# Patient Record
Sex: Female | Born: 1970 | Race: Black or African American | Hispanic: No | Smoking: Never smoker
Health system: Southern US, Community
[De-identification: ages and names within clinical notes are randomized; demographics above are authoritative.]

## PROBLEM LIST (undated history)

## (undated) DIAGNOSIS — F329 Major depressive disorder, single episode, unspecified: Secondary | ICD-10-CM

## (undated) DIAGNOSIS — F32A Depression, unspecified: Secondary | ICD-10-CM

## (undated) DIAGNOSIS — J45909 Unspecified asthma, uncomplicated: Secondary | ICD-10-CM

## (undated) DIAGNOSIS — T7840XA Allergy, unspecified, initial encounter: Secondary | ICD-10-CM

## (undated) DIAGNOSIS — Z91018 Allergy to other foods: Secondary | ICD-10-CM

## (undated) DIAGNOSIS — Z9889 Other specified postprocedural states: Secondary | ICD-10-CM

## (undated) HISTORY — DX: Allergy, unspecified, initial encounter: T78.40XA

## (undated) HISTORY — DX: Major depressive disorder, single episode, unspecified: F32.9

## (undated) HISTORY — DX: Other specified postprocedural states: Z98.890

## (undated) HISTORY — DX: Depression, unspecified: F32.A

---

## 1999-12-01 ENCOUNTER — Encounter: Payer: Self-pay | Admitting: Family Medicine

## 1999-12-01 ENCOUNTER — Encounter: Admission: RE | Admit: 1999-12-01 | Discharge: 1999-12-01 | Payer: Self-pay | Admitting: Family Medicine

## 2000-12-27 ENCOUNTER — Other Ambulatory Visit: Admission: RE | Admit: 2000-12-27 | Discharge: 2000-12-27 | Payer: Self-pay | Admitting: Family Medicine

## 2001-11-24 ENCOUNTER — Emergency Department (HOSPITAL_COMMUNITY): Admission: EM | Admit: 2001-11-24 | Discharge: 2001-11-24 | Payer: Self-pay | Admitting: *Deleted

## 2003-06-05 ENCOUNTER — Inpatient Hospital Stay (HOSPITAL_COMMUNITY): Admission: AD | Admit: 2003-06-05 | Discharge: 2003-06-07 | Payer: Self-pay | Admitting: Obstetrics and Gynecology

## 2003-06-15 ENCOUNTER — Encounter: Admission: RE | Admit: 2003-06-15 | Discharge: 2003-07-15 | Payer: Self-pay | Admitting: Obstetrics and Gynecology

## 2003-07-12 ENCOUNTER — Other Ambulatory Visit: Admission: RE | Admit: 2003-07-12 | Discharge: 2003-07-12 | Payer: Self-pay | Admitting: Obstetrics and Gynecology

## 2003-07-16 ENCOUNTER — Encounter: Admission: RE | Admit: 2003-07-16 | Discharge: 2003-08-15 | Payer: Self-pay | Admitting: Obstetrics and Gynecology

## 2003-08-16 ENCOUNTER — Encounter: Admission: RE | Admit: 2003-08-16 | Discharge: 2003-09-15 | Payer: Self-pay | Admitting: Obstetrics and Gynecology

## 2004-09-24 ENCOUNTER — Other Ambulatory Visit: Admission: RE | Admit: 2004-09-24 | Discharge: 2004-09-24 | Payer: Self-pay | Admitting: Obstetrics and Gynecology

## 2006-08-23 ENCOUNTER — Emergency Department (HOSPITAL_COMMUNITY): Admission: EM | Admit: 2006-08-23 | Discharge: 2006-08-23 | Payer: Self-pay | Admitting: Family Medicine

## 2006-12-21 ENCOUNTER — Emergency Department (HOSPITAL_COMMUNITY): Admission: EM | Admit: 2006-12-21 | Discharge: 2006-12-21 | Payer: Self-pay | Admitting: Emergency Medicine

## 2010-01-27 ENCOUNTER — Encounter: Admission: RE | Admit: 2010-01-27 | Discharge: 2010-01-27 | Payer: Self-pay | Admitting: Obstetrics and Gynecology

## 2012-12-11 ENCOUNTER — Other Ambulatory Visit: Payer: Self-pay | Admitting: Family Medicine

## 2013-01-06 ENCOUNTER — Other Ambulatory Visit: Payer: Self-pay | Admitting: Family Medicine

## 2013-06-25 ENCOUNTER — Other Ambulatory Visit: Payer: Self-pay | Admitting: Family Medicine

## 2013-08-10 ENCOUNTER — Encounter: Payer: Self-pay | Admitting: Physician Assistant

## 2013-08-10 ENCOUNTER — Ambulatory Visit (INDEPENDENT_AMBULATORY_CARE_PROVIDER_SITE_OTHER): Admitting: Physician Assistant

## 2013-08-10 VITALS — BP 132/90 | HR 68 | Temp 98.3°F | Resp 18 | Ht 65.0 in | Wt 204.0 lb

## 2013-08-10 DIAGNOSIS — H109 Unspecified conjunctivitis: Secondary | ICD-10-CM

## 2013-08-10 DIAGNOSIS — J329 Chronic sinusitis, unspecified: Secondary | ICD-10-CM

## 2013-08-10 MED ORDER — MONTELUKAST SODIUM 10 MG PO TABS: ORAL_TABLET | ORAL | Status: DC

## 2013-08-10 MED ORDER — PREDNISONE 20 MG PO TABS: ORAL_TABLET | ORAL | Status: DC

## 2013-08-10 MED ORDER — CETIRIZINE HCL 10 MG PO TABS: ORAL_TABLET | ORAL | Status: DC

## 2013-08-10 MED ORDER — SULFACETAMIDE SODIUM 10 % OP SOLN: 2.0000 [drp] | OPHTHALMIC | Status: DC

## 2013-08-10 MED ORDER — AZITHROMYCIN 250 MG PO TABS: ORAL_TABLET | ORAL | Status: DC

## 2013-08-10 NOTE — Progress Notes (Signed)
    Patient ID: Pamela Wilkerson MRN: [id number], DOB: 09/27/1970, 43 y.o. Date of Encounter: 08/10/2013, 10:55 AM    Chief Complaint:  Chief Complaint  Patient presents with  . c/o ongoing sinus problem    this morning eyes where all crusted shut     HPI: 43 y.o. year old female reports that she has been having pain in both of her cheeks and teeth area off and on for 2 weeks. Says that the last several days have gotten worse with a lot of pain in both cheeks. Has been feeling " tired and crappy."  Had no known fevers or chills. No cough. This Morning developed mucousy drainage in both eyes and eyelashes his were crusty. Missed work this past Monday then "went to work and just  suffered through the last 2 days."     Home Meds: See attached medication section for any medications that were entered at today's visit. The computer does not put those onto this list.The following list is a list of meds entered prior to today's visit.   No current outpatient prescriptions on file prior to visit.   No current facility-administered medications on file prior to visit.    Allergies:  Allergies  Allergen Reactions  . Pineapple   . Pyridium [Phenazopyridine Hcl]   . Penicillins Itching and Rash  . Sudafed [Pseudoephedrine Hcl] Rash      Review of Systems: See HPI for pertinent ROS. All other ROS negative.    Physical Exam: Blood pressure 132/90, pulse 68, temperature 98.3 F (36.8 C), temperature source Oral, resp. rate 18, height 5\' 5"  (1.651 m), weight 204 lb (92.534 kg)., Body mass index is 33.95 kg/(m^2). General:  Female. Appears in no acute distress. HEENT: Normocephalic, atraumatic, eyes without discharge at present, conjunctiva with minimal injection, sclera non-icteric, nares are without discharge. Bilateral auditory canals clear, TM's are without perforation, pearly grey and translucent with reflective cone of light bilaterally. Oral cavity moist, posterior pharynx without exudate,  erythema, peritonsillar abscess. Bilateral maxillary sinuses are severely tender with percussion. Minimal tenderness with percussion of frontal sinuses. Neck: Supple. No thyromegaly. No lymphadenopathy. Lungs: Clear bilaterally to auscultation without wheezes, rales, or rhonchi. Breathing is unlabored. Heart: Regular rhythm. No murmurs, rubs, or gallops. Msk:  Strength and tone normal for age. Extremities/Skin: Warm and dry.  Neuro: Alert and oriented X 3. Moves all extremities spontaneously. Gait is normal. CNII-XII grossly in tact. Psych:  Responds to questions appropriately with a normal affect.     ASSESSMENT AND PLAN:  43 y.o. year old female with  1. Sinusitis Pcn allery - predniSONE (DELTASONE) 20 MG tablet; Take 3 daily for 2 days, then 2 daily for 2 days, then 1 daily for 2 days.  Dispense: 12 tablet; Refill: 0 - azithromycin (ZITHROMAX) 250 MG tablet; Day 1: Take 2 daily.  Days 2-5: Take 1 daily.  Dispense: 6 tablet; Refill: 0  2. Conjunctivitis - sulfacetamide (BLEPH-10) 10 % ophthalmic solution; Place 2 drops into both eyes every 3 (three) hours while awake.  Dispense: 15 mL; Refill: 0  Note given for out of work 08/06/13, 08/10/13, 1:30/15  F/U if symptoms do not resolve after completion of the above medications.   Signed, [address]Jehad Bisono Beth NewportDixon, [place], [facility] 08/10/2013 10:55 AM

## 2013-12-06 ENCOUNTER — Other Ambulatory Visit: Payer: Self-pay | Admitting: Obstetrics and Gynecology

## 2013-12-06 DIAGNOSIS — R928 Other abnormal and inconclusive findings on diagnostic imaging of breast: Secondary | ICD-10-CM

## 2013-12-19 ENCOUNTER — Ambulatory Visit
Admission: RE | Admit: 2013-12-19 | Discharge: 2013-12-19 | Disposition: A | Discharge status: Home / Self Care | Source: Ambulatory Visit | Attending: Obstetrics and Gynecology | Admitting: Obstetrics and Gynecology

## 2013-12-19 ENCOUNTER — Encounter (INDEPENDENT_AMBULATORY_CARE_PROVIDER_SITE_OTHER): Payer: Self-pay

## 2013-12-19 DIAGNOSIS — R928 Other abnormal and inconclusive findings on diagnostic imaging of breast: Secondary | ICD-10-CM

## 2014-04-15 ENCOUNTER — Encounter (HOSPITAL_COMMUNITY): Payer: Self-pay | Admitting: Emergency Medicine

## 2014-04-15 ENCOUNTER — Emergency Department (INDEPENDENT_AMBULATORY_CARE_PROVIDER_SITE_OTHER)
Admission: EM | Admit: 2014-04-15 | Discharge: 2014-04-15 | Disposition: A | Discharge status: Home / Self Care | Source: Home / Self Care

## 2014-04-15 DIAGNOSIS — J309 Allergic rhinitis, unspecified: Secondary | ICD-10-CM

## 2014-04-15 HISTORY — DX: Unspecified asthma, uncomplicated: J45.909

## 2014-04-15 HISTORY — DX: Allergy to other foods: Z91.018

## 2014-04-15 LAB — POCT RAPID STREP A: Streptococcus, Group A Screen (Direct): NEGATIVE

## 2014-04-15 MED ORDER — TRIAMCINOLONE ACETONIDE 55 MCG/ACT NA AERO: 2.0000 | INHALATION_SPRAY | Freq: Every day | NASAL | Status: DC

## 2014-04-15 MED ORDER — CETIRIZINE-PSEUDOEPHEDRINE ER 5-120 MG PO TB12: 1.0000 | ORAL_TABLET | Freq: Every day | ORAL | Status: DC

## 2014-04-15 NOTE — ED Notes (Signed)
C/o headache and head congestion onset 10/2.  Worse yesterday and tried Mucinex and tylenol.  Could not sleep the past 2 nights.  Took a benadryl last night.  Both ears are clogged up. Blowing blood tinged yellowish mucous from her nose.

## 2014-04-15 NOTE — ED Provider Notes (Signed)
CSN: [id number]     Arrival date & time 04/15/14  1032 History   None    Chief Complaint  Patient presents with  . Headache   (Consider location/radiation/quality/duration/timing/severity/associated sxs/prior Treatment)  HPI  She is a 43 year old female presenting today with complaints of a headache and sore throat that started this past Friday. Patient has a history of environmental allergies and asthma. Patient states she has had some shortness of breath with mild coughing. Patient is complaining of voice changes, but denies any difficulty swallowing. States she feels like she had a fever on Friday and is complaining of decreased appetite. Symptoms include cough, congestion, and sneezing; some itching and ear stuffiness/ear pain.    Past Medical History  Diagnosis Date  . Asthma   . Multiple food allergies    History reviewed. No pertinent past surgical history. History reviewed. No pertinent family history. History  Substance Use Topics  . Smoking status: Never Smoker   . Smokeless tobacco: Never Used  . Alcohol Use: No   OB History   Grav Para Term Preterm Abortions TAB SAB Ect Mult Living                 Review of Systems  Constitutional: Positive for fever, chills, appetite change and fatigue.  HENT: Positive for congestion, ear pain, sinus pressure, sneezing, sore throat and voice change. Negative for facial swelling and trouble swallowing.   Eyes: Negative.   Respiratory: Positive for cough and shortness of breath. Negative for choking, chest tightness, wheezing and stridor.   Cardiovascular: Negative.  Negative for chest pain, palpitations and leg swelling.  Gastrointestinal: Negative for nausea, vomiting, diarrhea and constipation.  Endocrine: Negative.   Genitourinary: Negative.   Musculoskeletal: Negative.   Skin: Negative.   Allergic/Immunologic: Positive for environmental allergies.  Neurological: Negative.   Hematological: Negative.    Psychiatric/Behavioral: Negative.   The patient states she's supposed to regularly take her Singulair and Zyrtec, however admits has not been taking it regularly as directed.  Allergies  Aspartame and phenylalanine; Food color red; Pineapple; Pyridium; and Penicillins  Home Medications   Prior to Admission medications   Medication Sig Start Date End Date Taking? Authorizing Provider  cetirizine (ZYRTEC) 10 MG tablet TAKE ONE TABLET BY MOUTH ONE TIME DAILY 08/10/13  Yes Dorena BodoMary B Dixon, PA-C  clonazePAM (KLONOPIN) 0.5 MG tablet Take 0.5 mg by mouth daily as needed for anxiety.   Yes Historical Provider, MD  DULoxetine (CYMBALTA) 30 MG capsule Take 30 mg by mouth 2 (two) times daily.   Yes Historical Provider, MD  Flaxseed, Linseed, (FLAX SEEDS PO) Take 2 tablets by mouth 3 (three) times daily.   Yes Historical Provider, MD  Ginger, Zingiber officinalis, (GINGER ROOT PO) Take 1 Can by mouth daily.   Yes Historical Provider, MD  Misc Natural Products (TART CHERRY ADVANCED PO) Take 2 tablets by mouth 3 (three) times daily.   Yes Historical Provider, MD  montelukast (SINGULAIR) 10 MG tablet TAKE ONE TABLET BY MOUTH ONE TIME DAILY 08/10/13  Yes Dorena BodoMary B Dixon, PA-C  Norethindrone-Ethinyl Estradiol-Fe Biphas (LO LOESTRIN FE) 1 MG-10 MCG / 10 MCG tablet Take 1 tablet by mouth daily.   Yes Historical Provider, MD  UNABLE TO FIND 1 tablet 2 (two) times daily. TUMERIC   Yes Historical Provider, MD  azithromycin (ZITHROMAX) 250 MG tablet Day 1: Take 2 daily.  Days 2-5: Take 1 daily. 08/10/13   Patriciaann ClanMary B Dixon, PA-C  cetirizine-pseudoephedrine (ZYRTEC-D) 5-120 MG per tablet Take  1 tablet by mouth daily. 04/15/14   Servando Salina, NP  predniSONE (DELTASONE) 20 MG tablet Take 3 daily for 2 days, then 2 daily for 2 days, then 1 daily for 2 days. 08/10/13   Patriciaann Clan Dixon, PA-C  sulfacetamide (BLEPH-10) 10 % ophthalmic solution Place 2 drops into both eyes every 3 (three) hours while awake. 08/10/13   Patriciaann Clan Dixon, PA-C   triamcinolone (NASACORT ALLERGY 24HR) 55 MCG/ACT AERO nasal inhaler Place 2 sprays into the nose daily. 04/15/14   Servando Salina, NP   BP 147/81  Pulse 78  Temp(Src) 98.4 F (36.9 C) (Oral)  Resp 18  SpO2 98%  LMP 03/25/2014  Physical Exam  Nursing note and vitals reviewed. Constitutional: She is oriented to person, place, and time. She appears well-developed and well-nourished. No distress.  HENT:  Head: Normocephalic and atraumatic.  Right Ear: External ear normal.  Left Ear: External ear normal.  Mild erythema noted in posterior oropharynx. No evidence of exudate or patches. Bilateral tympanic membranes pearly gray in appearance with light reflexes present but distorted secondary to presence of fluid present behind TMs.  Bony prominences visualized. Bilateral nares patent, however turbinates are swollen and boggy in appearance.  Eyes: Conjunctivae are normal. Pupils are equal, round, and reactive to light. Right eye exhibits no discharge. Left eye exhibits no discharge. No scleral icterus.  Neck: Normal range of motion. Neck supple. No tracheal deviation present.  Mild anterior cervical lymphadenopathy bilaterally. Negative for nuchal rigidity.  Cardiovascular: Normal rate, regular rhythm, normal heart sounds and intact distal pulses.  Exam reveals no gallop and no friction rub.   No murmur heard. Pulmonary/Chest: Effort normal and breath sounds normal. No stridor. No respiratory distress. She has no wheezes. She has no rales. She exhibits no tenderness.  No adventitious breath sounds noted.  Lymphadenopathy:    She has cervical adenopathy.  Neurological: She is alert and oriented to person, place, and time.  Skin: Skin is warm and dry. No rash noted. She is not diaphoretic. No erythema. No pallor.    ED Course  Procedures (including critical care time) Labs Review Labs Reviewed  POCT RAPID STREP A ([facility] URG CARE ONLY)   Imaging Review No results found.   MDM   1.  Allergic rhinitis, unspecified allergic rhinitis type    Meds ordered this encounter  Medications  . DULoxetine (CYMBALTA) 30 MG capsule    Sig: Take 30 mg by mouth 2 (two) times daily.  Marland Kitchen triamcinolone (NASACORT ALLERGY 24HR) 55 MCG/ACT AERO nasal inhaler    Sig: Place 2 sprays into the nose daily.    Dispense:  1 Inhaler    Refill:  2  . cetirizine-pseudoephedrine (ZYRTEC-D) 5-120 MG per tablet    Sig: Take 1 tablet by mouth daily.    Dispense:  15 tablet    Refill:  0    Discussed the appropriate use of antibiotics with patient.  Patient agrees with diagnosis and verbalizes understanding and agrees to plan of care.      Servando Salina, NP 04/15/14 1202

## 2014-04-15 NOTE — Discharge Instructions (Signed)
A neti pot is a container designed to rinse debris or mucus from your nasal cavity. You might use a neti pot to treat symptoms of nasal allergies, sinus problems or colds. °If you choose to make your own saltwater solution, it's important to use bottled water that has been distilled or sterilized. Tap water is acceptable if it's been boiled for several minutes and then left to cool until it is lukewarm. °To use the neti pot, tilt your head sideways over the sink and place the spout of the neti pot in the upper nostril. Breathing through your open mouth, gently pour the saltwater solution into your upper nostril so that the liquid drains through the lower nostril. Repeat on the other side. °Be sure to rinse the irrigation device after each use with similarly distilled, sterile, previously boiled and cooled, or filtered water and leave open to air dry. °Neti pots are often available in pharmacies and health food stores, as well online.  ° ° °Allergic Rhinitis °Allergic rhinitis is when the mucous membranes in the nose respond to allergens. Allergens are particles in the air that cause your body to have an allergic reaction. This causes you to release allergic antibodies. Through a chain of events, these eventually cause you to release histamine into the blood stream. Although meant to protect the body, it is this release of histamine that causes your discomfort, such as frequent sneezing, congestion, and an itchy, runny nose.  °CAUSES  °Seasonal allergic rhinitis (hay fever) is caused by pollen allergens that may come from grasses, trees, and weeds. Year-round allergic rhinitis (perennial allergic rhinitis) is caused by allergens such as house dust mites, pet dander, and mold spores.  °SYMPTOMS  °· Nasal stuffiness (congestion). °· Itchy, runny nose with sneezing and tearing of the eyes. °DIAGNOSIS  °Your health care provider can help you determine the allergen or allergens that trigger your symptoms. If you and your  health care provider are unable to determine the allergen, skin or blood testing may be used. °TREATMENT  °Allergic rhinitis does not have a cure, but it can be controlled by: °· Medicines and allergy shots (immunotherapy). °· Avoiding the allergen. °Hay fever may often be treated with antihistamines in pill or nasal spray forms. Antihistamines block the effects of histamine. There are over-the-counter medicines that may help with nasal congestion and swelling around the eyes. Check with your health care provider before taking or giving this medicine.  °If avoiding the allergen or the medicine prescribed do not work, there are many new medicines your health care provider can prescribe. Stronger medicine may be used if initial measures are ineffective. Desensitizing injections can be used if medicine and avoidance does not work. Desensitization is when a patient is given ongoing shots until the body becomes less sensitive to the allergen. Make sure you follow up with your health care provider if problems continue. °HOME CARE INSTRUCTIONS °It is not possible to completely avoid allergens, but you can reduce your symptoms by taking steps to limit your exposure to them. It helps to know exactly what you are allergic to so that you can avoid your specific triggers. °SEEK MEDICAL CARE IF:  °· You have a fever. °· You develop a cough that does not stop easily (persistent). °· You have shortness of breath. °· You start wheezing. °· Symptoms interfere with normal daily activities. °Document Released: 03/24/2001 Document Revised: 07/04/2013 Document Reviewed: 03/06/2013 °Exit[employer]® Patient Information ©2015 [employer]. This information is not intended to replace advice given   to you by your health care provider. Make sure you discuss any questions you have with your health care provider. ° °

## 2014-04-16 ENCOUNTER — Other Ambulatory Visit: Payer: Self-pay | Admitting: Physician Assistant

## 2014-04-16 NOTE — Telephone Encounter (Signed)
Medication refilled per protocol. 

## 2014-04-17 ENCOUNTER — Ambulatory Visit: Admitting: Family Medicine

## 2014-04-17 LAB — CULTURE, GROUP A STREP

## 2014-04-17 NOTE — ED Provider Notes (Signed)
Medical screening examination/treatment/procedure(s) were performed by resident physician or non-physician practitioner and as supervising physician I was immediately available for consultation/collaboration.   Barkley BrunsKINDL,Robby Bulkley DOUGLAS MD.   Linna HoffJames D Ezra Marquess, MD 04/17/14 [id number]

## 2014-08-21 ENCOUNTER — Encounter: Payer: Self-pay | Admitting: Family Medicine

## 2014-08-21 ENCOUNTER — Other Ambulatory Visit: Payer: Self-pay | Admitting: Physician Assistant

## 2014-08-21 NOTE — Telephone Encounter (Signed)
Pt has not been seen in over one year.  No show for last appt.  Refill denied.  Letter to patient to make appt.

## 2015-03-05 ENCOUNTER — Other Ambulatory Visit: Payer: Self-pay

## 2015-03-05 DIAGNOSIS — Z1231 Encounter for screening mammogram for malignant neoplasm of breast: Secondary | ICD-10-CM

## 2015-03-07 ENCOUNTER — Ambulatory Visit
Admission: RE | Admit: 2015-03-07 | Discharge: 2015-03-07 | Disposition: A | Discharge status: Home / Self Care | Source: Ambulatory Visit

## 2015-03-07 DIAGNOSIS — Z1231 Encounter for screening mammogram for malignant neoplasm of breast: Secondary | ICD-10-CM

## 2015-03-08 ENCOUNTER — Encounter: Payer: Self-pay | Admitting: Family Medicine

## 2015-03-08 ENCOUNTER — Ambulatory Visit (INDEPENDENT_AMBULATORY_CARE_PROVIDER_SITE_OTHER): Admitting: Family Medicine

## 2015-03-08 VITALS — BP 120/76 | HR 68 | Temp 98.6°F | Resp 14 | Ht 65.0 in | Wt 198.0 lb

## 2015-03-08 DIAGNOSIS — J019 Acute sinusitis, unspecified: Secondary | ICD-10-CM

## 2015-03-08 DIAGNOSIS — B9689 Other specified bacterial agents as the cause of diseases classified elsewhere: Secondary | ICD-10-CM

## 2015-03-08 MED ORDER — AZITHROMYCIN 250 MG PO TABS: ORAL_TABLET | ORAL | Status: DC

## 2015-03-08 MED ORDER — CETIRIZINE HCL 10 MG PO TABS: ORAL_TABLET | ORAL | Status: AC

## 2015-03-08 MED ORDER — TRIAMCINOLONE ACETONIDE 55 MCG/ACT NA AERO: 2.0000 | INHALATION_SPRAY | Freq: Every day | NASAL | Status: DC

## 2015-03-08 MED ORDER — EPINEPHRINE 0.3 MG/0.3ML IJ SOAJ: 0.3000 mg | Freq: Once | INTRAMUSCULAR | Status: AC

## 2015-03-08 MED ORDER — PREDNISONE 20 MG PO TABS: ORAL_TABLET | ORAL | Status: DC

## 2015-03-08 MED ORDER — MONTELUKAST SODIUM 10 MG PO TABS
ORAL_TABLET | ORAL | Status: DC
Start: 2015-03-08 — End: 2016-03-12

## 2015-03-08 MED ORDER — TRIAMCINOLONE ACETONIDE 55 MCG/ACT NA AERO: 2.0000 | INHALATION_SPRAY | Freq: Every day | NASAL | Status: AC

## 2015-03-08 MED ORDER — ALBUTEROL SULFATE HFA 108 (90 BASE) MCG/ACT IN AERS
INHALATION_SPRAY | RESPIRATORY_TRACT | Status: DC
Start: 2015-03-08 — End: 2017-04-21

## 2015-03-08 NOTE — Progress Notes (Signed)
Patient ID: Miamor Ayler, female   DOB: June 23, 1971, 44 y.o.   MRN: [id number]   Subjective:    Patient ID: Almyra Brace, female    DOB: 1970-11-27, 44 y.o.   MRN: [id number]  Patient presents for Illness  issue here with sinusitis. She has history of chronic sinus problems as well as allergy she was being followed by an allergist in the past. It has been over a year since she has had a sinus infection. She's been using Sudafed as well as her Nasacort but she has not been improving over the past week. The past 3 days have been severe. She has not had any significant fever. No known sick contacts otherwise.    Review Of Systems:  GEN- denies fatigue, fever, weight loss,weakness, recent illness HEENT- denies eye drainage, change in vision, +nasal discharge, CVS- denies chest pain, palpitations RESP- denies SOB, cough, wheeze ABD- denies N/V, change in stools, abd pain GU- denies dysuria, hematuria, dribbling, incontinence MSK- denies joint pain, muscle aches, injury Neuro- denies headache, dizziness, syncope, seizure activity       Objective:    BP 120/76 mmHg  Pulse 68  Temp(Src) 98.6 F (37 C) (Oral)  Resp 14  Ht  (1.651 m)  Wt 198 lb (89.812 kg)  BMI 32.95 kg/m2  LMP 02/15/2015 GEN- NAD, alert and oriented x3 HEENT- PERRL, EOMI, non injected sclera, pink conjunctiva, MMM, oropharynx mild injection, TM clear bilat no effusion,  + maxillary sinus tenderness, inflammed turbinates,  Nasal drainage  Neck- Supple, shotty  LAD CVS- RRR, no murmur RESP-CTAB EXT- No edema Pulses- Radial 2+          Assessment & Plan:      Problem List Items Addressed This Visit    None    Visit Diagnoses    Acute bacterial sinusitis    -  Primary    history of recurrent sinsuitis with underlying allergies, treat with zpak, steroids, continue nasal regimen, Sudafed to decongest    Relevant Medications    cetirizine (ZYRTEC) 10 MG tablet    triamcinolone (NASACORT ALLERGY 24HR) 55  MCG/ACT AERO nasal inhaler    predniSONE (DELTASONE) 20 MG tablet    azithromycin (ZITHROMAX) 250 MG tablet       Note: This dictation was prepared with Dragon dictation along with smaller phrase technology. Any transcriptional errors that result from this process are unintentional.

## 2015-03-08 NOTE — Patient Instructions (Signed)
Take antibiotics as prescribed Continue allergy medication F/U as needed

## 2015-03-08 NOTE — Addendum Note (Signed)
Addended by: Phillips Odor on: 03/08/2015 03:13 PM   Modules accepted: Orders

## 2015-09-11 ENCOUNTER — Ambulatory Visit: Admitting: Internal Medicine

## 2015-09-11 ENCOUNTER — Ambulatory Visit (INDEPENDENT_AMBULATORY_CARE_PROVIDER_SITE_OTHER): Payer: 59 | Admitting: Internal Medicine

## 2015-09-11 ENCOUNTER — Other Ambulatory Visit (INDEPENDENT_AMBULATORY_CARE_PROVIDER_SITE_OTHER): Payer: 59

## 2015-09-11 ENCOUNTER — Encounter: Payer: Self-pay | Admitting: Internal Medicine

## 2015-09-11 ENCOUNTER — Telehealth: Payer: Self-pay

## 2015-09-11 VITALS — BP 112/78 | HR 63 | Temp 98.0°F | Resp 12 | Ht 65.0 in | Wt 189.0 lb

## 2015-09-11 DIAGNOSIS — K5900 Constipation, unspecified: Secondary | ICD-10-CM

## 2015-09-11 DIAGNOSIS — Z23 Encounter for immunization: Secondary | ICD-10-CM

## 2015-09-11 DIAGNOSIS — Z0000 Encounter for general adult medical examination without abnormal findings: Secondary | ICD-10-CM

## 2015-09-11 DIAGNOSIS — E669 Obesity, unspecified: Secondary | ICD-10-CM

## 2015-09-11 LAB — CBC
HCT: 39.5 % (ref 36.0–46.0)
HEMOGLOBIN: 13 g/dL (ref 12.0–15.0)
MCHC: 32.8 g/dL (ref 30.0–36.0)
MCV: 80.5 fl (ref 78.0–100.0)
PLATELETS: 289 10*3/uL (ref 150.0–400.0)
RBC: 4.91 Mil/uL (ref 3.87–5.11)
RDW: 13.8 % (ref 11.5–15.5)
WBC: 6 10*3/uL (ref 4.0–10.5)

## 2015-09-11 LAB — COMPREHENSIVE METABOLIC PANEL
ALBUMIN: 4.2 g/dL (ref 3.5–5.2)
ALT: 12 U/L (ref 0–35)
AST: 13 U/L (ref 0–37)
Alkaline Phosphatase: 46 U/L (ref 39–117)
BILIRUBIN TOTAL: 0.4 mg/dL (ref 0.2–1.2)
BUN: 15 mg/dL (ref 6–23)
CALCIUM: 9.4 mg/dL (ref 8.4–10.5)
CHLORIDE: 104 meq/L (ref 96–112)
CO2: 28 mEq/L (ref 19–32)
CREATININE: 0.86 mg/dL (ref 0.40–1.20)
GFR: 92.66 mL/min (ref >60.00)
Glucose, Bld: 95 mg/dL (ref 70–99)
Potassium: 4.4 mEq/L (ref 3.5–5.1)
Sodium: 138 mEq/L (ref 135–145)
Total Protein: 7 g/dL (ref 6.0–8.3)

## 2015-09-11 LAB — HEMOGLOBIN A1C: Hgb A1c MFr Bld: 5.9 % (ref 4.6–6.5)

## 2015-09-11 LAB — LIPID PANEL
Cholesterol: 128 mg/dL (ref 0–200)
HDL: 52.3 mg/dL (ref >39.00)
LDL Cholesterol: 61 mg/dL (ref 0–99)
NONHDL: 75.22
TRIGLYCERIDES: 73 mg/dL (ref 0.0–149.0)
Total CHOL/HDL Ratio: 2
VLDL: 14.6 mg/dL (ref 0.0–40.0)

## 2015-09-11 LAB — TSH: TSH: 2.46 u[IU]/mL (ref 0.35–4.50)

## 2015-09-11 LAB — T4, FREE: Free T4: 0.8 ng/dL (ref 0.60–1.60)

## 2015-09-11 MED ORDER — LINACLOTIDE 290 MCG PO CAPS: 290.0000 ug | ORAL_CAPSULE | Freq: Every day | ORAL | Status: DC

## 2015-09-11 NOTE — Telephone Encounter (Signed)
PA initiated via CoverMyMeds Key [id number]

## 2015-09-11 NOTE — Progress Notes (Signed)
Pre visit review using our clinic review tool, if applicable. No additional management support is needed unless otherwise documented below in the visit note. 

## 2015-09-11 NOTE — Patient Instructions (Signed)
We have sent in linzess instead since the amitiza has red dye in the capsules.   Take 1 pill daily and within 1-2 weeks you should notice more regular bowel movements. Another medicine to consider trying that is over the counter is called senokot-d which helps to move the bowels a little bit better.   Health Maintenance, Female Adopting a healthy lifestyle and getting preventive care can go a long way to promote health and wellness. Talk with your health care provider about what schedule of regular examinations is right for you. This is a good chance for you to check in with your provider about disease prevention and staying healthy. In between checkups, there are plenty of things you can do on your own. Experts have done a lot of research about which lifestyle changes and preventive measures are most likely to keep you healthy. Ask your health care provider for more information. WEIGHT AND DIET  Eat a healthy diet  Be sure to include plenty of vegetables, fruits, low-fat dairy products, and lean protein.  Do not eat a lot of foods high in solid fats, added sugars, or salt.  Get regular exercise. This is one of the most important things you can do for your health.  Most adults should exercise for at least 150 minutes each week. The exercise should increase your heart rate and make you sweat (moderate-intensity exercise).  Most adults should also do strengthening exercises at least twice a week. This is in addition to the moderate-intensity exercise.  Maintain a healthy weight  Body mass index (BMI) is a measurement that can be used to identify possible weight problems. It estimates body fat based on height and weight. Your health care provider can help determine your BMI and help you achieve or maintain a healthy weight.  For females 58 years of age and older:   A BMI below 18.5 is considered underweight.  A BMI of 18.5 to 24.9 is normal.  A BMI of 25 to 29.9 is considered  overweight.  A BMI of 30 and above is considered obese.  Watch levels of cholesterol and blood lipids  You should start having your blood tested for lipids and cholesterol at 43 years of age, then have this test every 5 years.  You may need to have your cholesterol levels checked more often if:  Your lipid or cholesterol levels are high.  You are older than 43 years of age.  You are at high risk for heart disease.  CANCER SCREENING   Lung Cancer  Lung cancer screening is recommended for adults 64-40 years old who are at high risk for lung cancer because of a history of smoking.  A yearly low-dose CT scan of the lungs is recommended for people who:  Currently smoke.  Have quit within the past 15 years.  Have at least a 30-pack-year history of smoking. A pack year is smoking an average of one pack of cigarettes a day for 1 year.  Yearly screening should continue until it has been 15 years since you quit.  Yearly screening should stop if you develop a health problem that would prevent you from having lung cancer treatment.  Breast Cancer  Practice breast self-awareness. This means understanding how your breasts normally appear and feel.  It also means doing regular breast self-exams. Let your health care provider know about any changes, no matter how small.  If you are in your 20s or 30s, you should have a clinical breast exam (CBE) by  a health care provider every 1-3 years as part of a regular health exam.  If you are 50 or older, have a CBE every year. Also consider having a breast X-ray (mammogram) every year.  If you have a family history of breast cancer, talk to your health care provider about genetic screening.  If you are at high risk for breast cancer, talk to your health care provider about having an MRI and a mammogram every year.  Breast cancer gene (BRCA) assessment is recommended for women who have family members with BRCA-related cancers. BRCA-related  cancers include:  Breast.  Ovarian.  Tubal.  Peritoneal cancers.  Results of the assessment will determine the need for genetic counseling and BRCA1 and BRCA2 testing. Cervical Cancer Your health care provider may recommend that you be screened regularly for cancer of the pelvic organs (ovaries, uterus, and vagina). This screening involves a pelvic examination, including checking for microscopic changes to the surface of your cervix (Pap test). You may be encouraged to have this screening done every 3 years, beginning at age 43.  For women ages 92-65, health care providers may recommend pelvic exams and Pap testing every 3 years, or they may recommend the Pap and pelvic exam, combined with testing for human papilloma virus (HPV), every 5 years. Some types of HPV increase your risk of cervical cancer. Testing for HPV may also be done on women of any age with unclear Pap test results.  Other health care providers may not recommend any screening for nonpregnant women who are considered low risk for pelvic cancer and who do not have symptoms. Ask your health care provider if a screening pelvic exam is right for you.  If you have had past treatment for cervical cancer or a condition that could lead to cancer, you need Pap tests and screening for cancer for at least 20 years after your treatment. If Pap tests have been discontinued, your risk factors (such as having a new sexual partner) need to be reassessed to determine if screening should resume. Some women have medical problems that increase the chance of getting cervical cancer. In these cases, your health care provider may recommend more frequent screening and Pap tests. Colorectal Cancer  This type of cancer can be detected and often prevented.  Routine colorectal cancer screening usually begins at 43 years of age and continues through 43 years of age.  Your health care provider may recommend screening at an earlier age if you have risk  factors for colon cancer.  Your health care provider may also recommend using home test kits to check for hidden blood in the stool.  A small camera at the end of a tube can be used to examine your colon directly (sigmoidoscopy or colonoscopy). This is done to check for the earliest forms of colorectal cancer.  Routine screening usually begins at age 16.  Direct examination of the colon should be repeated every 5-10 years through 43 years of age. However, you may need to be screened more often if early forms of precancerous polyps or small growths are found. Skin Cancer  Check your skin from head to toe regularly.  Tell your health care provider about any new moles or changes in moles, especially if there is a change in a mole's shape or color.  Also tell your health care provider if you have a mole that is larger than the size of a pencil eraser.  Always use sunscreen. Apply sunscreen liberally and repeatedly throughout the day.  Protect yourself by wearing long sleeves, pants, a wide-brimmed hat, and sunglasses whenever you are outside. HEART DISEASE, DIABETES, AND HIGH BLOOD PRESSURE   High blood pressure causes heart disease and increases the risk of stroke. High blood pressure is more likely to develop in:  People who have blood pressure in the high end of the normal range (130-139/85-89 mm Hg).  People who are overweight or obese.  People who are African American.  If you are 29-4 years of age, have your blood pressure checked every 3-5 years. If you are 42 years of age or older, have your blood pressure checked every year. You should have your blood pressure measured twice--once when you are at a hospital or clinic, and once when you are not at a hospital or clinic. Record the average of the two measurements. To check your blood pressure when you are not at a hospital or clinic, you can use:  An automated blood pressure machine at a pharmacy.  A home blood pressure  monitor.  If you are between 27 years and 61 years old, ask your health care provider if you should take aspirin to prevent strokes.  Have regular diabetes screenings. This involves taking a blood sample to check your fasting blood sugar level.  If you are at a normal weight and have a low risk for diabetes, have this test once every three years after 43 years of age.  If you are overweight and have a high risk for diabetes, consider being tested at a younger age or more often. PREVENTING INFECTION  Hepatitis B  If you have a higher risk for hepatitis B, you should be screened for this virus. You are considered at high risk for hepatitis B if:  You were born in a country where hepatitis B is common. Ask your health care provider which countries are considered high risk.  Your parents were born in a high-risk country, and you have not been immunized against hepatitis B (hepatitis B vaccine).  You have HIV or AIDS.  You use needles to inject street drugs.  You live with someone who has hepatitis B.  You have had sex with someone who has hepatitis B.  You get hemodialysis treatment.  You take certain medicines for conditions, including cancer, organ transplantation, and autoimmune conditions. Hepatitis C  Blood testing is recommended for:  Everyone born from [birth year] through 1963.  Anyone with known risk factors for hepatitis C. Sexually transmitted infections (STIs)  You should be screened for sexually transmitted infections (STIs) including gonorrhea and chlamydia if:  You are sexually active and are younger than 43 years of age.  You are older than 43 years of age and your health care provider tells you that you are at risk for this type of infection.  Your sexual activity has changed since you were last screened and you are at an increased risk for chlamydia or gonorrhea. Ask your health care provider if you are at risk.  If you do not have HIV, but are at risk, it may be  recommended that you take a prescription medicine daily to prevent HIV infection. This is called pre-exposure prophylaxis (PrEP). You are considered at risk if:  You are sexually active and do not regularly use condoms or know the HIV status of your partner(s).  You take drugs by injection.  You are sexually active with a partner who has HIV. Talk with your health care provider about whether you are at high risk of being infected  with HIV. If you choose to begin PrEP, you should first be tested for HIV. You should then be tested every 3 months for as long as you are taking PrEP.  PREGNANCY   If you are premenopausal and you may become pregnant, ask your health care provider about preconception counseling.  If you may become pregnant, take 400 to 800 micrograms (mcg) of folic acid every day.  If you want to prevent pregnancy, talk to your health care provider about birth control (contraception). OSTEOPOROSIS AND MENOPAUSE   Osteoporosis is a disease in which the bones lose minerals and strength with aging. This can result in serious bone fractures. Your risk for osteoporosis can be identified using a bone density scan.  If you are 30 years of age or older, or if you are at risk for osteoporosis and fractures, ask your health care provider if you should be screened.  Ask your health care provider whether you should take a calcium or vitamin D supplement to lower your risk for osteoporosis.  Menopause may have certain physical symptoms and risks.  Hormone replacement therapy may reduce some of these symptoms and risks. Talk to your health care provider about whether hormone replacement therapy is right for you.  HOME CARE INSTRUCTIONS   Schedule regular health, dental, and eye exams.  Stay current with your immunizations.   Do not use any tobacco products including cigarettes, chewing tobacco, or electronic cigarettes.  If you are pregnant, do not drink alcohol.  If you are  breastfeeding, limit how much and how often you drink alcohol.  Limit alcohol intake to no more than 1 drink per day for nonpregnant women. One drink equals 12 ounces of beer, 5 ounces of wine, or 1 ounces of hard liquor.  Do not use street drugs.  Do not share needles.  Ask your health care provider for help if you need support or information about quitting drugs.  Tell your health care provider if you often feel depressed.  Tell your health care provider if you have ever been abused or do not feel safe at home.   This information is not intended to replace advice given to you by your health care provider. Make sure you discuss any questions you have with your health care provider.   Document Released: 01/12/2011 Document Revised: 07/20/2014 Document Reviewed: 05/31/2013 Elsevier Interactive Patient Education [employer].

## 2015-09-13 ENCOUNTER — Encounter: Payer: Self-pay | Admitting: Internal Medicine

## 2015-09-13 DIAGNOSIS — K5900 Constipation, unspecified: Secondary | ICD-10-CM | POA: Insufficient documentation

## 2015-09-13 DIAGNOSIS — E669 Obesity, unspecified: Secondary | ICD-10-CM | POA: Insufficient documentation

## 2015-09-13 NOTE — Assessment & Plan Note (Signed)
Rx for linzess at this time. Miralax has not been effective in the past and eats high fiber diet with enough water. She had requested to try amitiza but the red dye indicated the change to try linzess. No indication for workup at this time. Checking CBC to ensure no anemia (would be indication for workup). No family history of colon cancer.

## 2015-09-13 NOTE — Telephone Encounter (Signed)
PA has been approved until 03/19/2016.   LVM for pt to call back as soon as possible.  RE: PA for Linzess has been approved.   Called and faxed pharmacy the received notification.

## 2015-09-13 NOTE — Progress Notes (Signed)
   Subjective:    Patient ID: Pamela Wilkerson, female    DOB: 10/27/1970, 45 y.o.   MRN: [id number]  HPI The patient is a new 45 YO female coming in with several problems including constipation. She has had more problems with it in the last 10 years. No blood in the stool. Goes 1-2 times per week. Some straining associated with going. No significant hemorrhoids. Has tried fiber and diet changes. Drinks 80 oz water daily. Tries to exercise some.   [facility], social history reviewed and updated.   Review of Systems  Constitutional: Negative for fever, activity change, appetite change, fatigue and unexpected weight change.  HENT: Negative.   Eyes: Negative.   Respiratory: Negative for cough, chest tightness, shortness of breath and wheezing.   Cardiovascular: Negative for chest pain, palpitations and leg swelling.  Gastrointestinal: Positive for constipation and abdominal distention. Negative for nausea, vomiting, abdominal pain, diarrhea and blood in stool.  Musculoskeletal: Negative.   Skin: Negative.   Neurological: Negative.   Psychiatric/Behavioral: Negative.       Objective:   Physical Exam  Constitutional: She is oriented to person, place, and time. She appears well-developed and well-nourished.  Overweight  HENT:  Head: Normocephalic and atraumatic.  Eyes: EOM are normal.  Neck: Normal range of motion.  Cardiovascular: Normal rate and regular rhythm.   Pulmonary/Chest: Effort normal and breath sounds normal. No respiratory distress. She has no wheezes. She has no rales.  Abdominal: Soft. Bowel sounds are normal. She exhibits no distension. There is no tenderness. There is no rebound and no guarding.  Musculoskeletal: She exhibits no edema.  Neurological: She is alert and oriented to person, place, and time.  Skin: Skin is warm and dry.  Psychiatric: She has a normal mood and affect.   Filed Vitals:   09/11/15 1104  BP: 112/78  Pulse: 63  Temp: 98 F (36.7 C)  TempSrc: Oral    Resp: 12  Height: 5\' 5"  (1.651 m)  Weight: 189 lb (85.73 kg)  SpO2: 98%      Assessment & Plan:  Tdap given for visit.

## 2015-09-13 NOTE — Assessment & Plan Note (Signed)
She is working on her diet to help with weight loss and will improve her exercise.

## 2015-11-22 ENCOUNTER — Other Ambulatory Visit: Payer: Self-pay

## 2015-11-22 DIAGNOSIS — Z1231 Encounter for screening mammogram for malignant neoplasm of breast: Secondary | ICD-10-CM

## 2016-01-28 ENCOUNTER — Other Ambulatory Visit: Payer: Self-pay | Admitting: Internal Medicine

## 2016-01-28 ENCOUNTER — Ambulatory Visit
Admission: RE | Admit: 2016-01-28 | Discharge: 2016-01-28 | Disposition: A | Discharge status: Home / Self Care | Payer: 59 | Source: Ambulatory Visit

## 2016-01-28 ENCOUNTER — Other Ambulatory Visit: Payer: Self-pay

## 2016-01-28 DIAGNOSIS — Z1231 Encounter for screening mammogram for malignant neoplasm of breast: Secondary | ICD-10-CM

## 2016-03-06 ENCOUNTER — Ambulatory Visit
Admission: RE | Admit: 2016-03-06 | Discharge: 2016-03-06 | Disposition: A | Discharge status: Home / Self Care | Payer: 59 | Source: Ambulatory Visit | Attending: Internal Medicine | Admitting: Internal Medicine

## 2016-03-06 DIAGNOSIS — Z1231 Encounter for screening mammogram for malignant neoplasm of breast: Secondary | ICD-10-CM

## 2016-03-11 ENCOUNTER — Other Ambulatory Visit: Payer: Self-pay | Admitting: Family Medicine

## 2016-03-12 ENCOUNTER — Other Ambulatory Visit: Payer: Self-pay | Admitting: Internal Medicine

## 2016-04-16 ENCOUNTER — Other Ambulatory Visit: Payer: Self-pay | Admitting: Internal Medicine

## 2016-05-20 ENCOUNTER — Other Ambulatory Visit: Payer: Self-pay | Admitting: Internal Medicine

## 2016-07-28 DIAGNOSIS — F3289 Other specified depressive episodes: Secondary | ICD-10-CM | POA: Diagnosis not present

## 2016-07-28 DIAGNOSIS — F332 Major depressive disorder, recurrent severe without psychotic features: Secondary | ICD-10-CM | POA: Diagnosis not present

## 2016-07-28 DIAGNOSIS — G4700 Insomnia, unspecified: Secondary | ICD-10-CM | POA: Diagnosis not present

## 2016-07-28 DIAGNOSIS — F413 Other mixed anxiety disorders: Secondary | ICD-10-CM | POA: Diagnosis not present

## 2016-12-04 ENCOUNTER — Other Ambulatory Visit: Payer: Self-pay | Admitting: Obstetrics and Gynecology

## 2016-12-04 DIAGNOSIS — Z1231 Encounter for screening mammogram for malignant neoplasm of breast: Secondary | ICD-10-CM

## 2016-12-21 DIAGNOSIS — F3289 Other specified depressive episodes: Secondary | ICD-10-CM | POA: Diagnosis not present

## 2016-12-21 DIAGNOSIS — G4700 Insomnia, unspecified: Secondary | ICD-10-CM | POA: Diagnosis not present

## 2016-12-21 DIAGNOSIS — F332 Major depressive disorder, recurrent severe without psychotic features: Secondary | ICD-10-CM | POA: Diagnosis not present

## 2016-12-21 DIAGNOSIS — F413 Other mixed anxiety disorders: Secondary | ICD-10-CM | POA: Diagnosis not present

## 2017-01-07 DIAGNOSIS — F413 Other mixed anxiety disorders: Secondary | ICD-10-CM | POA: Diagnosis not present

## 2017-01-07 DIAGNOSIS — F332 Major depressive disorder, recurrent severe without psychotic features: Secondary | ICD-10-CM | POA: Diagnosis not present

## 2017-01-07 DIAGNOSIS — F3289 Other specified depressive episodes: Secondary | ICD-10-CM | POA: Diagnosis not present

## 2017-01-07 DIAGNOSIS — R635 Abnormal weight gain: Secondary | ICD-10-CM | POA: Diagnosis not present

## 2017-01-08 ENCOUNTER — Telehealth: Payer: Self-pay | Admitting: Internal Medicine

## 2017-01-08 NOTE — Telephone Encounter (Addendum)
Pt was started on metformin XR 500 for weight gain related to anti-psychotic and her A1C was elevated and Pamela Wilkerson has scheduled labs ro recheck A1C   [facility]  [phone]

## 2017-01-08 NOTE — Telephone Encounter (Signed)
I don't understand this message.  

## 2017-01-12 DIAGNOSIS — F413 Other mixed anxiety disorders: Secondary | ICD-10-CM | POA: Diagnosis not present

## 2017-01-12 DIAGNOSIS — F332 Major depressive disorder, recurrent severe without psychotic features: Secondary | ICD-10-CM | POA: Diagnosis not present

## 2017-01-12 DIAGNOSIS — R635 Abnormal weight gain: Secondary | ICD-10-CM | POA: Diagnosis not present

## 2017-01-12 DIAGNOSIS — F3289 Other specified depressive episodes: Secondary | ICD-10-CM | POA: Diagnosis not present

## 2017-03-08 ENCOUNTER — Encounter: Payer: Self-pay | Admitting: Nurse Practitioner

## 2017-03-08 ENCOUNTER — Ambulatory Visit (INDEPENDENT_AMBULATORY_CARE_PROVIDER_SITE_OTHER): Payer: BLUE CROSS/BLUE SHIELD | Admitting: Nurse Practitioner

## 2017-03-08 ENCOUNTER — Ambulatory Visit
Admission: RE | Admit: 2017-03-08 | Discharge: 2017-03-08 | Disposition: A | Discharge status: Home / Self Care | Payer: 59 | Source: Ambulatory Visit | Attending: Obstetrics and Gynecology | Admitting: Obstetrics and Gynecology

## 2017-03-08 ENCOUNTER — Other Ambulatory Visit (INDEPENDENT_AMBULATORY_CARE_PROVIDER_SITE_OTHER): Payer: BLUE CROSS/BLUE SHIELD

## 2017-03-08 VITALS — BP 118/82 | HR 59 | Temp 98.3°F | Ht 65.0 in | Wt 202.0 lb

## 2017-03-08 DIAGNOSIS — R739 Hyperglycemia, unspecified: Secondary | ICD-10-CM

## 2017-03-08 DIAGNOSIS — Z0001 Encounter for general adult medical examination with abnormal findings: Secondary | ICD-10-CM

## 2017-03-08 DIAGNOSIS — Z01419 Encounter for gynecological examination (general) (routine) without abnormal findings: Secondary | ICD-10-CM | POA: Diagnosis not present

## 2017-03-08 DIAGNOSIS — Z114 Encounter for screening for human immunodeficiency virus [HIV]: Secondary | ICD-10-CM

## 2017-03-08 DIAGNOSIS — Z6834 Body mass index (BMI) 34.0-34.9, adult: Secondary | ICD-10-CM | POA: Diagnosis not present

## 2017-03-08 DIAGNOSIS — Z1231 Encounter for screening mammogram for malignant neoplasm of breast: Secondary | ICD-10-CM

## 2017-03-08 DIAGNOSIS — K5904 Chronic idiopathic constipation: Secondary | ICD-10-CM

## 2017-03-08 LAB — LIPID PANEL
Cholesterol: 141 mg/dL (ref 0–200)
HDL: 63.6 mg/dL
LDL Cholesterol: 61 mg/dL (ref 0–99)
NonHDL: 77.44
Total CHOL/HDL Ratio: 2
Triglycerides: 81 mg/dL (ref 0.0–149.0)
VLDL: 16.2 mg/dL (ref 0.0–40.0)

## 2017-03-08 LAB — HEPATIC FUNCTION PANEL
ALK PHOS: 39 U/L (ref 39–117)
ALT: 11 U/L (ref 0–35)
AST: 15 U/L (ref 0–37)
Albumin: 3.8 g/dL (ref 3.5–5.2)
BILIRUBIN DIRECT: 0.2 mg/dL (ref 0.0–0.3)
BILIRUBIN TOTAL: 0.5 mg/dL (ref 0.2–1.2)
TOTAL PROTEIN: 6.7 g/dL (ref 6.0–8.3)

## 2017-03-08 LAB — BASIC METABOLIC PANEL
BUN: 12 mg/dL (ref 6–23)
CHLORIDE: 102 meq/L (ref 96–112)
CO2: 31 meq/L (ref 19–32)
Calcium: 9.1 mg/dL (ref 8.4–10.5)
Creatinine, Ser: 0.88 mg/dL (ref 0.40–1.20)
GFR: 89.61 mL/min (ref >60.00)
Glucose, Bld: 83 mg/dL (ref 70–99)
POTASSIUM: 3.9 meq/L (ref 3.5–5.1)
Sodium: 137 mEq/L (ref 135–145)

## 2017-03-08 LAB — CBC
HCT: 37.4 % (ref 36.0–46.0)
HEMOGLOBIN: 12.3 g/dL (ref 12.0–15.0)
MCHC: 32.8 g/dL (ref 30.0–36.0)
MCV: 81.7 fl (ref 78.0–100.0)
Platelets: 269 10*3/uL (ref 150.0–400.0)
RBC: 4.58 Mil/uL (ref 3.87–5.11)
RDW: 13.9 % (ref 11.5–15.5)
WBC: 6.8 10*3/uL (ref 4.0–10.5)

## 2017-03-08 LAB — TSH: TSH: 2.5 u[IU]/mL (ref 0.35–4.50)

## 2017-03-08 LAB — HEMOGLOBIN A1C: Hgb A1c MFr Bld: 5.7 % (ref 4.6–6.5)

## 2017-03-08 MED ORDER — LUBIPROSTONE 8 MCG PO CAPS: 8.0000 ug | ORAL_CAPSULE | Freq: Two times a day (BID) | ORAL | 0 refills | Status: DC

## 2017-03-08 NOTE — Progress Notes (Deleted)
Subjective:    Patient ID: Pamela Wilkerson, female    DOB: 1971-03-04, 46 y.o.   MRN: [id number]  Patient presents today for complete physical or establish care (new patient) and ***  HPI  constipation: Chronic x 1year.  no improvement with linzess No OTC used. Has BM 1-2times a week. No hard stools. ABD bloating. No FH of colon cancer of polyps. No diarrhea.  Insomnia: Managed by [facility]: Dr. Vikki Ports laVoire, NP-C Used of seroquel x 1year and trazodone x 8months,  Mood disorder: Use of lamictal.  Immunizations: (TDAP, Hep C screen, Pneumovax, Influenza, zoster)  Health Maintenance  Topic Date Due  . HIV Screening  10/16/1987  . Flu Shot  02/10/2017  . Pap Smear  12/25/2017  . Tetanus Vaccine  09/10/2025   Diet:weight watcher..  Weight:  Wt Readings from Last 3 Encounters:  03/08/17 202 lb (91.6 kg)  09/11/15 189 lb (85.7 kg)  03/08/15 198 lb (89.8 kg)   *** Exercise: walking, rowing, treadmil, elliptical, 5x/week Fall Risk:*** No flowsheet data found. Home Safety:*** Depression/Suicide:*** No flowsheet data found. No flowsheet data found. Colonoscopy (every 5-81yrs, >50-47yrs):*** Dexa (every 2-57yrs, >33yrs):*** Pap Smear (every 74yrs for >21-29 without HPV, every 85yrs for >30-42yrs with HPV):up to date with GYN Mammogram (yearly, >65yrs):up to date with GYN.  Vision:up to date Dental: up to date Advanced Directive:*** No flowsheet data found. Sexual History (birth control, marital status, STD):***   Medications and allergies reviewed with patient and updated if appropriate.  Patient Active Problem List   Diagnosis Date Noted  . Constipation 09/13/2015  . Obesity 09/13/2015    Current Outpatient Prescriptions on File Prior to Visit  Medication Sig Dispense Refill  . albuterol (VENTOLIN HFA) 108 (90 BASE) MCG/ACT inhaler Inhale two puffs by mouth every four to six hours as needed 18 g 6  . cetirizine (ZYRTEC) 10 MG tablet TAKE  ONE TABLET BY MOUTH ONE TIME DAILY 30 tablet 11  . clonazePAM (KLONOPIN) 0.5 MG tablet Take 0.5 mg by mouth daily as needed for anxiety.    Marland Kitchen EPINEPHrine 0.3 mg/0.3 mL IJ SOAJ injection Inject 0.3 mLs (0.3 mg total) into the muscle once. 1 Device 1  . lamoTRIgine (LAMICTAL) 100 MG tablet Take 100 mg by mouth daily.    . Misc Natural Products (TART CHERRY ADVANCED PO) Take 2 tablets by mouth 3 (three) times daily.    . montelukast (SINGULAIR) 10 MG tablet TAKE 1 TABLET DAILY. 30 tablet 0  . triamcinolone (NASACORT ALLERGY 24HR) 55 MCG/ACT AERO nasal inhaler Place 2 sprays into the nose daily. 1 Inhaler 2  . Flaxseed, Linseed, (FLAX SEEDS PO) Take 2 tablets by mouth 3 (three) times daily.    . Ginger, Zingiber officinalis, (GINGER ROOT PO) Take 1 Can by mouth daily.    . Norethindrone-Ethinyl Estradiol-Fe Biphas (LO LOESTRIN FE) 1 MG-10 MCG / 10 MCG tablet Take 1 tablet by mouth daily.    . traZODone (DESYREL) 50 MG tablet Take 50 mg by mouth at bedtime.     No current facility-administered medications on file prior to visit.     Past Medical History:  Diagnosis Date  . Allergy   . Asthma   . Depression   . Multiple food allergies     History reviewed. No pertinent surgical history.  Social History   Social History  . Marital status: Divorced    Spouse name: N/A  . Number of children: N/A  . Years of education: N/A  Social History Main Topics  . Smoking status: Never Smoker  . Smokeless tobacco: Never Used  . Alcohol use No  . Drug use: No  . Sexual activity: Not Currently   Other Topics Concern  . None   Social History Narrative  . None    Family History  Problem Relation Age of Onset  . Diabetes Maternal Uncle   . Mental retardation Father   . Hyperlipidemia Father   . Diabetes Maternal Aunt   . Cancer Maternal Aunt 57       stage 4 brain cancer.  . Arthritis Maternal Grandmother   . Stroke Maternal Grandmother   . Diabetes Maternal Grandmother   .  Hyperlipidemia Paternal Grandmother   . Heart disease Paternal Grandmother         ROS  Objective:   Vitals:   03/08/17 1339  BP: 118/82  Pulse: (!) 59  Temp: 98.3 F (36.8 C)  SpO2: 98%    Body mass index is 33.61 kg/m.   Physical Examination:  Physical Exam  ASSESSMENT and PLAN:  [name] was seen today for annual exam.  Diagnoses and all orders for this visit:  Encounter for preventative adult health care exam with abnormal findings -     Basic metabolic panel; Future -     Hepatic function panel; Future -     Lipid panel; Future -     CBC; Future -     TSH; Future -     Cancel: HIV antibody; Future  Chronic idiopathic constipation -     lubiprostone (AMITIZA) 8 MCG capsule; Take 1 capsule (8 mcg total) by mouth 2 (two) times daily with a meal.  Hyperglycemia -     Hemoglobin A1c; Future  Encounter for screening for human immunodeficiency virus (HIV) -     Cancel: HIV antibody; Future    No problem-specific Assessment & Plan notes found for this encounter.      Follow up: No Follow-up on file.  Alysia Penna, NP

## 2017-03-08 NOTE — Progress Notes (Signed)
Subjective:    Patient ID: Pamela Wilkerson, female    DOB: [birth year], 46 y.o.   MRN: [id number]  Patient presents today for complete physical  HPI Constipation: Chronic x 1year. Waxing and waning  no improvement with linzess, No OTC used. Has BM 1-2times a week. Denies any hard stool, but they are infrequent in last 1year and accompanied by ABD bloating No FH of colon cancer or polyps or constipation or IBS or crohns or colitis Denies any diarrhea or N/V or weight loss or melena or hematochezia or hemorrhoid  Insomnia: Managed by [facility]: Dr. Vikki Ports laVoire, NP-C Use of seroquel x 1year and trazodone x 8months. She is concerned about weight gain and memory, hence will like to stop seroquel.  Mood disorder: Also [facility] Use of lamictal and clonazepam.  Immunizations: (TDAP, Hep C screen, Pneumovax, Influenza, zoster)  Health Maintenance  Topic Date Due  . HIV Screening  10/16/1987  . Flu Shot  02/10/2017  . Pap Smear  12/25/2017  . Tetanus Vaccine  09/10/2025   Diet:weight watcher..  Weight:  Wt Readings from Last 3 Encounters:  03/08/17 202 lb (91.6 kg)  09/11/15 189 lb (85.7 kg)  03/08/15 198 lb (89.8 kg)    Exercise: walking, rowing, treadmil, elliptical, 5x/week.  Fall Risk: Fall Risk  03/08/2017  Falls in the past year? No   Home Safety:home alone.  Depression/Suicide: Depression screen PHQ 2/9 03/08/2017  Decreased Interest 1  Down, Depressed, Hopeless 1  PHQ - 2 Score 2  Suicidal thoughts 0   No flowsheet data found. Pap Smear (every 39yrs for >21-29 without HPV, every 88yrs for >30-42yrs with HPV):up to date with GYN  Mammogram (yearly, >25yrs):up to date with GYN.  Vision:up to date  Dental: up to date   Medications and allergies reviewed with patient and updated if appropriate.  Patient Active Problem List   Diagnosis Date Noted  . Constipation 09/13/2015  . Obesity 09/13/2015     Current Outpatient Prescriptions on File Prior to Visit  Medication Sig Dispense Refill  . albuterol (VENTOLIN HFA) 108 (90 BASE) MCG/ACT inhaler Inhale two puffs by mouth every four to six hours as needed 18 g 6  . cetirizine (ZYRTEC) 10 MG tablet TAKE ONE TABLET BY MOUTH ONE TIME DAILY 30 tablet 11  . clonazePAM (KLONOPIN) 0.5 MG tablet Take 0.5 mg by mouth daily as needed for anxiety.    Marland Kitchen EPINEPHrine 0.3 mg/0.3 mL IJ SOAJ injection Inject 0.3 mLs (0.3 mg total) into the muscle once. 1 Device 1  . lamoTRIgine (LAMICTAL) 100 MG tablet Take 100 mg by mouth daily.    . Misc Natural Products (TART CHERRY ADVANCED PO) Take 2 tablets by mouth 3 (three) times daily.    . montelukast (SINGULAIR) 10 MG tablet TAKE 1 TABLET DAILY. 30 tablet 0  . triamcinolone (NASACORT ALLERGY 24HR) 55 MCG/ACT AERO nasal inhaler Place 2 sprays into the nose daily. 1 Inhaler 2  . Flaxseed, Linseed, (FLAX SEEDS PO) Take 2 tablets by mouth 3 (three) times daily.    . Ginger, Zingiber officinalis, (GINGER ROOT PO) Take 1 Can by mouth daily.    . Norethindrone-Ethinyl Estradiol-Fe Biphas (LO LOESTRIN FE) 1 MG-10 MCG / 10 MCG tablet Take 1 tablet by mouth daily.    . traZODone (DESYREL) 50 MG tablet Take 50 mg by mouth at bedtime.     No current facility-administered medications on file prior to visit.     Past Medical  History:  Diagnosis Date  . Allergy   . Asthma   . Depression   . Multiple food allergies     History reviewed. No pertinent surgical history.  Social History   Social History  . Marital status: Divorced    Spouse name: N/A  . Number of children: N/A  . Years of education: N/A   Social History Main Topics  . Smoking status: Never Smoker  . Smokeless tobacco: Never Used  . Alcohol use No  . Drug use: No  . Sexual activity: Not Currently   Other Topics Concern  . None   Social History Narrative  . None    Family History  Problem Relation Age of Onset  . Diabetes Maternal  Uncle   . Mental retardation Father   . Hyperlipidemia Father   . Diabetes Maternal Aunt   . Cancer Maternal Aunt 57       stage 4 brain cancer.  . Arthritis Maternal Grandmother   . Stroke Maternal Grandmother   . Diabetes Maternal Grandmother   . Hyperlipidemia Paternal Grandmother   . Heart disease Paternal Grandmother         Review of Systems  Constitutional: Negative for fever, malaise/fatigue and weight loss.  HENT: Negative for congestion and sore throat.   Eyes:       Negative for visual changes  Respiratory: Negative for cough and shortness of breath.   Cardiovascular: Negative for chest pain, palpitations and leg swelling.  Gastrointestinal: Positive for constipation. Negative for blood in stool, diarrhea, heartburn, melena, nausea and vomiting.  Genitourinary: Negative for dysuria, frequency and urgency.  Musculoskeletal: Negative for falls, joint pain and myalgias.  Skin: Negative for rash.  Neurological: Negative for dizziness, sensory change, weakness and headaches.  Endo/Heme/Allergies: Does not bruise/bleed easily.  Psychiatric/Behavioral: Positive for depression. Negative for substance abuse and suicidal ideas. The patient is nervous/anxious and has insomnia.     Objective:   Vitals:   03/08/17 1339  BP: 118/82  Pulse: (!) 59  Temp: 98.3 F (36.8 C)  SpO2: 98%    Body mass index is 33.61 kg/m.   Physical Examination:  Physical Exam  Constitutional: She is oriented to person, place, and time and well-developed, well-nourished, and in no distress. No distress.  HENT:  Right Ear: External ear normal.  Left Ear: External ear normal.  Nose: Nose normal.  Mouth/Throat: Oropharynx is clear and moist. No oropharyngeal exudate.  Eyes: Pupils are equal, round, and reactive to light. Conjunctivae and EOM are normal. No scleral icterus.  Neck: Normal range of motion. Neck supple. No thyromegaly present.  Cardiovascular: Normal rate, normal heart sounds  and intact distal pulses.   Pulmonary/Chest: Effort normal and breath sounds normal. She exhibits no tenderness.  Abdominal: Soft. Bowel sounds are normal. She exhibits no distension. There is no tenderness.  Genitourinary:  Genitourinary Comments: Breast and pelvic exam deferred to GYN per patient  Musculoskeletal: Normal range of motion. She exhibits no edema or tenderness.  Lymphadenopathy:    She has no cervical adenopathy.  Neurological: She is alert and oriented to person, place, and time. Gait normal.  Skin: Skin is warm and dry.  Psychiatric: Affect and judgment normal.    ASSESSMENT and PLAN:  Louvina was seen today for annual exam.  Diagnoses and all orders for this visit:  Encounter for preventative adult health care exam with abnormal findings -     Basic metabolic panel; Future -     Hepatic function panel; Future -  Lipid panel; Future -     CBC; Future -     TSH; Future -     Cancel: HIV antibody; Future  Chronic idiopathic constipation -     lubiprostone (AMITIZA) 8 MCG capsule; Take 1 capsule (8 mcg total) by mouth 2 (two) times daily with a meal.  Hyperglycemia -     Hemoglobin A1c; Future    No problem-specific Assessment & Plan notes found for this encounter.      Follow up: Return if symptoms worsen or fail to improve.  Alysia Penna, NP

## 2017-03-08 NOTE — Patient Instructions (Addendum)
Please discuss discontinuation of Seroquel with psychiatrist.  Preventive Care 40-64 Years, Female Preventive care refers to lifestyle choices and visits with your health care provider that can promote health and wellness. What does preventive care include?  A yearly physical exam. This is also called an annual well check.  Dental exams once or twice a year.  Routine eye exams. Ask your health care provider how often you should have your eyes checked.  Personal lifestyle choices, including: ? Daily care of your teeth and gums. ? Regular physical activity. ? Eating a healthy diet. ? Avoiding tobacco and drug use. ? Limiting alcohol use. ? Practicing safe sex. ? Taking low-dose aspirin daily starting at age 26. ? Taking vitamin and mineral supplements as recommended by your health care provider. What happens during an annual well check? The services and screenings done by your health care provider during your annual well check will depend on your age, overall health, lifestyle risk factors, and family history of disease. Counseling Your health care provider may ask you questions about your:  Alcohol use.  Tobacco use.  Drug use.  Emotional well-being.  Home and relationship well-being.  Sexual activity.  Eating habits.  Work and work Statistician.  Method of birth control.  Menstrual cycle.  Pregnancy history.  Screening You may have the following tests or measurements:  Height, weight, and BMI.  Blood pressure.  Lipid and cholesterol levels. These may be checked every 5 years, or more frequently if you are over 48 years old.  Skin check.  Lung cancer screening. You may have this screening every year starting at age 14 if you have a 30-pack-year history of smoking and currently smoke or have quit within the past 15 years.  Fecal occult blood test (FOBT) of the stool. You may have this test every year starting at age 35.  Flexible sigmoidoscopy or  colonoscopy. You may have a sigmoidoscopy every 5 years or a colonoscopy every 10 years starting at age 91.  Hepatitis C blood test.  Hepatitis B blood test.  Sexually transmitted disease (STD) testing.  Diabetes screening. This is done by checking your blood sugar (glucose) after you have not eaten for a while (fasting). You may have this done every 1-3 years.  Mammogram. This may be done every 1-2 years. Talk to your health care provider about when you should start having regular mammograms. This may depend on whether you have a family history of breast cancer.  BRCA-related cancer screening. This may be done if you have a family history of breast, ovarian, tubal, or peritoneal cancers.  Pelvic exam and Pap test. This may be done every 3 years starting at age 45. Starting at age 63, this may be done every 5 years if you have a Pap test in combination with an HPV test.  Bone density scan. This is done to screen for osteoporosis. You may have this scan if you are at high risk for osteoporosis.  Discuss your test results, treatment options, and if necessary, the need for more tests with your health care provider. Vaccines Your health care provider may recommend certain vaccines, such as:  Influenza vaccine. This is recommended every year.  Tetanus, diphtheria, and acellular pertussis (Tdap, Td) vaccine. You may need a Td booster every 10 years.  Varicella vaccine. You may need this if you have not been vaccinated.  Zoster vaccine. You may need this after age 39.  Measles, mumps, and rubella (MMR) vaccine. You may need at least one  dose of MMR if you were born in 1957 or later. You may also need a second dose.  Pneumococcal 13-valent conjugate (PCV13) vaccine. You may need this if you have certain conditions and were not previously vaccinated.  Pneumococcal polysaccharide (PPSV23) vaccine. You may need one or two doses if you smoke cigarettes or if you have certain  conditions.  Meningococcal vaccine. You may need this if you have certain conditions.  Hepatitis A vaccine. You may need this if you have certain conditions or if you travel or work in places where you may be exposed to hepatitis A.  Hepatitis B vaccine. You may need this if you have certain conditions or if you travel or work in places where you may be exposed to hepatitis B.  Haemophilus influenzae type b (Hib) vaccine. You may need this if you have certain conditions.  Talk to your health care provider about which screenings and vaccines you need and how often you need them. This information is not intended to replace advice given to you by your health care provider. Make sure you discuss any questions you have with your health care provider. Document Released: 07/26/2015 Document Revised: 03/18/2016 Document Reviewed: 04/30/2015 Elsevier Interactive Patient Education  2017 [employer].

## 2017-03-09 DIAGNOSIS — F332 Major depressive disorder, recurrent severe without psychotic features: Secondary | ICD-10-CM | POA: Diagnosis not present

## 2017-03-09 DIAGNOSIS — F3289 Other specified depressive episodes: Secondary | ICD-10-CM | POA: Diagnosis not present

## 2017-03-09 DIAGNOSIS — F413 Other mixed anxiety disorders: Secondary | ICD-10-CM | POA: Diagnosis not present

## 2017-03-09 DIAGNOSIS — R635 Abnormal weight gain: Secondary | ICD-10-CM | POA: Diagnosis not present

## 2017-03-10 LAB — HM PAP SMEAR

## 2017-04-19 DIAGNOSIS — F3289 Other specified depressive episodes: Secondary | ICD-10-CM | POA: Diagnosis not present

## 2017-04-19 DIAGNOSIS — F332 Major depressive disorder, recurrent severe without psychotic features: Secondary | ICD-10-CM | POA: Diagnosis not present

## 2017-04-19 DIAGNOSIS — R635 Abnormal weight gain: Secondary | ICD-10-CM | POA: Diagnosis not present

## 2017-04-19 DIAGNOSIS — F413 Other mixed anxiety disorders: Secondary | ICD-10-CM | POA: Diagnosis not present

## 2017-04-21 ENCOUNTER — Other Ambulatory Visit: Payer: Self-pay | Admitting: Internal Medicine

## 2017-04-30 DIAGNOSIS — Z23 Encounter for immunization: Secondary | ICD-10-CM | POA: Diagnosis not present

## 2017-05-10 ENCOUNTER — Other Ambulatory Visit: Payer: Self-pay | Admitting: Internal Medicine

## 2017-07-04 ENCOUNTER — Other Ambulatory Visit: Payer: Self-pay | Admitting: Internal Medicine

## 2017-07-27 ENCOUNTER — Encounter: Payer: Self-pay | Admitting: Family Medicine

## 2017-07-27 ENCOUNTER — Ambulatory Visit: Payer: BLUE CROSS/BLUE SHIELD | Admitting: Family Medicine

## 2017-07-27 VITALS — BP 130/78 | HR 78 | Temp 98.8°F | Ht 65.0 in | Wt 194.0 lb

## 2017-07-27 DIAGNOSIS — J0110 Acute frontal sinusitis, unspecified: Secondary | ICD-10-CM | POA: Insufficient documentation

## 2017-07-27 MED ORDER — AZITHROMYCIN 250 MG PO TABS: ORAL_TABLET | ORAL | 0 refills | Status: DC

## 2017-07-27 NOTE — Patient Instructions (Signed)
Please try things such as zyrtec-D or allegra-D which is an antihistamine and decongestant.   Please try afrin which will help with nasal congestion but use for only three days.   Please also try using a netti pot on a regular occasion.  Honey can help with a sore throat.   

## 2017-07-27 NOTE — Assessment & Plan Note (Signed)
Possible for viral. No fevers.  - provided azithro to have on hand if no improvement  - counseled on supportive care

## 2017-07-27 NOTE — Progress Notes (Signed)
Pamela Wilkerson - 47 y.o. female MRN [id number]  Date of birth: 11/20/1970  SUBJECTIVE:  Including CC & ROS.  Chief Complaint  Patient presents with  . Sinus issues    Pamela Wilkerson is a 47 y.o. female that is presenting with  Sore throat/cough. Ongoing for one month. She has been taking Sudafed with no improvement. Denies fevers or chills. She admits to wheezing and coughing up mucous. She feels like her symptoms have been ongoing this time. Symptoms are moderate in nature. Has some improvement with using a humidifier.   Review of Systems  Constitutional: Negative for fever.  HENT: Positive for sinus pain and sore throat.   Respiratory: Positive for cough. Negative for shortness of breath.   Cardiovascular: Negative for chest pain.  Gastrointestinal: Negative for abdominal pain.  Musculoskeletal: Negative for gait problem.  Neurological: Negative for weakness.  Hematological: Negative for adenopathy.  Psychiatric/Behavioral: Negative for agitation.    HISTORY: Past Medical, Surgical, Social, and Family History Reviewed & Updated per EMR.   Pertinent Historical Findings include:  Past Medical History:  Diagnosis Date  . Allergy   . Asthma   . Depression   . Multiple food allergies     No past surgical history on file.  Allergies  Allergen Reactions  . Amoxicillin   . Aspartame And Phenylalanine Itching  . Food Color Red [Red Dye] Itching    and yellow dyes.  . Lactose Intolerance (Gi)   . Pineapple   . Pyridium [Phenazopyridine Hcl] Hives  . Strawberry (Diagnostic) Itching    Neck itching  . Penicillins Itching and Rash    Family History  Problem Relation Age of Onset  . Diabetes Maternal Uncle   . Mental retardation Father   . Hyperlipidemia Father   . Diabetes Maternal Aunt   . Cancer Maternal Aunt 57       stage 4 brain cancer.  . Arthritis Maternal Grandmother   . Stroke Maternal Grandmother   . Diabetes Maternal Grandmother   . Hyperlipidemia Paternal  Grandmother   . Heart disease Paternal Grandmother      Social History   Socioeconomic History  . Marital status: Divorced    Spouse name: Not on file  . Number of children: Not on file  . Years of education: Not on file  . Highest education level: Not on file  Social Needs  . Financial resource strain: Not on file  . Food insecurity - worry: Not on file  . Food insecurity - inability: Not on file  . Transportation needs - medical: Not on file  . Transportation needs - non-medical: Not on file  Occupational History  . Not on file  Tobacco Use  . Smoking status: Never Smoker  . Smokeless tobacco: Never Used  Substance and Sexual Activity  . Alcohol use: No  . Drug use: No  . Sexual activity: Not Currently  Other Topics Concern  . Not on file  Social History Narrative  . Not on file     PHYSICAL EXAM:  VS: BP 130/78 (BP Location: Left Arm, Patient Position: Sitting, Cuff Size: Normal)   Pulse 78   Temp 98.8 F (37.1 C) (Oral)   Ht 5\' 5"  (1.651 m)   Wt 194 lb (88 kg)   SpO2 98%   BMI 32.28 kg/m  Physical Exam Gen: NAD, alert, cooperative with exam,  ENT: normal lips, normal nasal mucosa, tympanic membranes clear and intact bilaterally, normal oropharynx, no cervical lymphadenopathy Eye: normal EOM, normal conjunctiva  and lids CV:  no edema, +2 pedal pulses, regular rate and rhythm, S1-S2   Resp: no accessory muscle use, non-labored, clear to auscultation bilaterally, no crackles or wheezes Skin: no rashes, no areas of induration  Neuro: normal tone, normal sensation to touch Psych:  normal insight, alert and oriented MSK: Normal gait, normal strength       ASSESSMENT & PLAN:   Acute non-recurrent frontal sinusitis Possible for viral. No fevers.  - provided azithro to have on hand if no improvement  - counseled on supportive care

## 2017-08-27 ENCOUNTER — Other Ambulatory Visit: Payer: Self-pay | Admitting: Internal Medicine

## 2017-09-16 DIAGNOSIS — N939 Abnormal uterine and vaginal bleeding, unspecified: Secondary | ICD-10-CM | POA: Diagnosis not present

## 2017-09-16 DIAGNOSIS — D259 Leiomyoma of uterus, unspecified: Secondary | ICD-10-CM | POA: Diagnosis not present

## 2017-09-30 ENCOUNTER — Other Ambulatory Visit: Payer: Self-pay | Admitting: Internal Medicine

## 2017-10-04 ENCOUNTER — Other Ambulatory Visit: Payer: Self-pay | Admitting: Internal Medicine

## 2017-11-10 ENCOUNTER — Other Ambulatory Visit: Payer: Self-pay | Admitting: Internal Medicine

## 2017-11-10 NOTE — Telephone Encounter (Signed)
Can you please make patient an appointment. Has not seen crawford in two years

## 2017-11-10 NOTE — Telephone Encounter (Signed)
montelukast refill Last OV: OV not found in chart where this med was addressed - was seen 07/27/17 with sinusitis- Last Refill:10/04/17 #30 tabs no refills  Pharmacy:[facility]  Dr Okey Dupre  Routing back to provider to review

## 2017-11-10 NOTE — Telephone Encounter (Signed)
Left message for patient to call back to schedule her physical appointment. Please let office know when appointment is scheduled so that this prescription can be sent in.

## 2017-11-10 NOTE — Telephone Encounter (Signed)
Copied from CRM [id number]. Topic: Inquiry >> Nov 10, 2017  9:09 AM Yvonna Alanis wrote: Reason for CRM: Patient called requesting a refill of Montelukast (SINGULAIR) 10 MG tablet. Patient contacted her pharmacy, but there are no refills available. Patient is completely out of this medication. Patient's preferred pharmacy is [facility] - [place], [place] - [address] [phone] (Phone)   [phone] (Fax).       Thank You!!!

## 2017-11-10 NOTE — Telephone Encounter (Signed)
Patient scheduled for CPE on 03/11/2018 at 9:00am with Crawford. She would like a call once the script is sent in.

## 2017-11-11 MED ORDER — MONTELUKAST SODIUM 10 MG PO TABS: 10.0000 mg | ORAL_TABLET | Freq: Every day | ORAL | 3 refills | Status: DC

## 2017-11-11 NOTE — Telephone Encounter (Signed)
Patient informed. Rx sent 

## 2017-11-21 DIAGNOSIS — Z9889 Other specified postprocedural states: Secondary | ICD-10-CM

## 2017-11-21 HISTORY — DX: Other specified postprocedural states: Z98.890

## 2017-12-10 DIAGNOSIS — D259 Leiomyoma of uterus, unspecified: Secondary | ICD-10-CM | POA: Diagnosis not present

## 2017-12-10 DIAGNOSIS — N920 Excessive and frequent menstruation with regular cycle: Secondary | ICD-10-CM | POA: Diagnosis not present

## 2017-12-21 DIAGNOSIS — F332 Major depressive disorder, recurrent severe without psychotic features: Secondary | ICD-10-CM | POA: Diagnosis not present

## 2017-12-21 DIAGNOSIS — F3289 Other specified depressive episodes: Secondary | ICD-10-CM | POA: Diagnosis not present

## 2017-12-21 DIAGNOSIS — R635 Abnormal weight gain: Secondary | ICD-10-CM | POA: Diagnosis not present

## 2017-12-21 DIAGNOSIS — F413 Other mixed anxiety disorders: Secondary | ICD-10-CM | POA: Diagnosis not present

## 2017-12-23 DIAGNOSIS — N939 Abnormal uterine and vaginal bleeding, unspecified: Secondary | ICD-10-CM | POA: Diagnosis not present

## 2017-12-23 DIAGNOSIS — N920 Excessive and frequent menstruation with regular cycle: Secondary | ICD-10-CM | POA: Diagnosis not present

## 2018-01-17 DIAGNOSIS — F332 Major depressive disorder, recurrent severe without psychotic features: Secondary | ICD-10-CM | POA: Diagnosis not present

## 2018-01-17 DIAGNOSIS — R635 Abnormal weight gain: Secondary | ICD-10-CM | POA: Diagnosis not present

## 2018-01-17 DIAGNOSIS — F413 Other mixed anxiety disorders: Secondary | ICD-10-CM | POA: Diagnosis not present

## 2018-01-17 DIAGNOSIS — F3289 Other specified depressive episodes: Secondary | ICD-10-CM | POA: Diagnosis not present

## 2018-03-02 ENCOUNTER — Other Ambulatory Visit: Payer: Self-pay | Admitting: Obstetrics and Gynecology

## 2018-03-02 DIAGNOSIS — Z1231 Encounter for screening mammogram for malignant neoplasm of breast: Secondary | ICD-10-CM

## 2018-03-11 ENCOUNTER — Other Ambulatory Visit: Payer: Self-pay | Admitting: Internal Medicine

## 2018-03-11 ENCOUNTER — Other Ambulatory Visit (INDEPENDENT_AMBULATORY_CARE_PROVIDER_SITE_OTHER): Payer: BLUE CROSS/BLUE SHIELD

## 2018-03-11 ENCOUNTER — Ambulatory Visit (INDEPENDENT_AMBULATORY_CARE_PROVIDER_SITE_OTHER): Payer: BLUE CROSS/BLUE SHIELD | Admitting: Internal Medicine

## 2018-03-11 ENCOUNTER — Encounter: Payer: Self-pay | Admitting: Internal Medicine

## 2018-03-11 VITALS — BP 104/80 | HR 77 | Temp 97.8°F | Ht 65.0 in | Wt 193.0 lb

## 2018-03-11 DIAGNOSIS — Z6832 Body mass index (BMI) 32.0-32.9, adult: Secondary | ICD-10-CM

## 2018-03-11 DIAGNOSIS — E6609 Other obesity due to excess calories: Secondary | ICD-10-CM | POA: Diagnosis not present

## 2018-03-11 DIAGNOSIS — Z23 Encounter for immunization: Secondary | ICD-10-CM | POA: Diagnosis not present

## 2018-03-11 DIAGNOSIS — K5904 Chronic idiopathic constipation: Secondary | ICD-10-CM

## 2018-03-11 DIAGNOSIS — Z0000 Encounter for general adult medical examination without abnormal findings: Secondary | ICD-10-CM

## 2018-03-11 LAB — LIPID PANEL
Cholesterol: 120 mg/dL (ref 0–200)
HDL: 60.8 mg/dL (ref >39.00)
LDL Cholesterol: 45 mg/dL (ref 0–99)
NonHDL: 59.07
TRIGLYCERIDES: 70 mg/dL (ref 0.0–149.0)
Total CHOL/HDL Ratio: 2
VLDL: 14 mg/dL (ref 0.0–40.0)

## 2018-03-11 LAB — CBC
HCT: 42.1 % (ref 36.0–46.0)
HEMOGLOBIN: 13.8 g/dL (ref 12.0–15.0)
MCHC: 32.6 g/dL (ref 30.0–36.0)
MCV: 81.9 fl (ref 78.0–100.0)
PLATELETS: 269 10*3/uL (ref 150.0–400.0)
RBC: 5.14 Mil/uL — AB (ref 3.87–5.11)
RDW: 13.9 % (ref 11.5–15.5)
WBC: 4.8 10*3/uL (ref 4.0–10.5)

## 2018-03-11 LAB — COMPREHENSIVE METABOLIC PANEL
ALBUMIN: 3.9 g/dL (ref 3.5–5.2)
ALT: 12 U/L (ref 0–35)
AST: 14 U/L (ref 0–37)
Alkaline Phosphatase: 32 U/L — ABNORMAL LOW (ref 39–117)
BILIRUBIN TOTAL: 0.5 mg/dL (ref 0.2–1.2)
BUN: 16 mg/dL (ref 6–23)
CALCIUM: 9.3 mg/dL (ref 8.4–10.5)
CO2: 27 mEq/L (ref 19–32)
CREATININE: 1.03 mg/dL (ref 0.40–1.20)
Chloride: 105 mEq/L (ref 96–112)
GFR: 74.39 mL/min (ref >60.00)
Glucose, Bld: 99 mg/dL (ref 70–99)
Potassium: 4.8 mEq/L (ref 3.5–5.1)
Sodium: 139 mEq/L (ref 135–145)
Total Protein: 6.9 g/dL (ref 6.0–8.3)

## 2018-03-11 LAB — TSH: TSH: 2.2 u[IU]/mL (ref 0.35–4.50)

## 2018-03-11 LAB — HEMOGLOBIN A1C: Hgb A1c MFr Bld: 5.8 % (ref 4.6–6.5)

## 2018-03-11 NOTE — Progress Notes (Signed)
   Subjective:    Patient ID: Pamela Wilkerson, female    DOB: 08/19/1970, 47 y.o.   MRN: [id number]  HPI The patient is a 47 YO female coming in for physical. No new concerns.   [facility]H, [facility], social history reviewed and updated.   Review of Systems  Constitutional: Negative.   HENT: Negative.   Eyes: Negative.   Respiratory: Negative for cough, chest tightness and shortness of breath.   Cardiovascular: Negative for chest pain, palpitations and leg swelling.  Gastrointestinal: Negative for abdominal distention, abdominal pain, constipation, diarrhea, nausea and vomiting.  Musculoskeletal: Negative.   Skin: Negative.   Neurological: Negative.   Psychiatric/Behavioral: Negative.       Objective:   Physical Exam  Constitutional: She is oriented to person, place, and time. She appears well-developed and well-nourished.  HENT:  Head: Normocephalic and atraumatic.  Eyes: EOM are normal.  Neck: Normal range of motion.  Cardiovascular: Normal rate and regular rhythm.  Pulmonary/Chest: Effort normal and breath sounds normal. No respiratory distress. She has no wheezes. She has no rales.  Abdominal: Soft. Bowel sounds are normal. She exhibits no distension. There is no tenderness. There is no rebound.  Musculoskeletal: She exhibits no edema.  Neurological: She is alert and oriented to person, place, and time. Coordination normal.  Skin: Skin is warm and dry.  Psychiatric: She has a normal mood and affect.   Vitals:   03/11/18 0847  BP: 104/80  Pulse: 77  Temp: 97.8 F (36.6 C)  TempSrc: Oral  SpO2: 97%  Weight: 193 lb (87.5 kg)  Height: 5\' 5"  (1.651 m)      Assessment & Plan:  Flu shot given at visit

## 2018-03-11 NOTE — Patient Instructions (Signed)

## 2018-03-11 NOTE — Assessment & Plan Note (Signed)
Taking magnesium daily for insomnia which is helping with constipation as well.

## 2018-03-11 NOTE — Assessment & Plan Note (Signed)
Flu shot given at visit. Tetanus up to date. Mammogram up to date, pap smear up to date with gyn. Counseled about sun safety and mole surveillance. Counseled about the dangers of distracted driving. Given 10 year screening recommendations. Declines HIV screening.

## 2018-03-11 NOTE — Assessment & Plan Note (Signed)
She is working on exercise to help with her weight.

## 2018-03-17 ENCOUNTER — Encounter: Payer: Self-pay | Admitting: Internal Medicine

## 2018-03-17 ENCOUNTER — Ambulatory Visit: Payer: BLUE CROSS/BLUE SHIELD | Admitting: Internal Medicine

## 2018-03-17 DIAGNOSIS — J309 Allergic rhinitis, unspecified: Secondary | ICD-10-CM | POA: Insufficient documentation

## 2018-03-17 DIAGNOSIS — J3089 Other allergic rhinitis: Secondary | ICD-10-CM | POA: Diagnosis not present

## 2018-03-17 MED ORDER — FLUTICASONE PROPIONATE 50 MCG/ACT NA SUSP: 2.0000 | Freq: Every day | NASAL | 6 refills | Status: AC

## 2018-03-17 NOTE — Patient Instructions (Signed)
We have sent in flonase to add to the regimen.

## 2018-03-17 NOTE — Progress Notes (Signed)
   Subjective:    Patient ID: Pamela Wilkerson, female    DOB: 1970/12/15, 47 y.o.   MRN: [id number]  HPI The patient is a 47 YO female coming in for sinus pressure going on for about 1 day. She is also having body aches. She is having some cough as well. Denies headache or migraines. She has tried sudafed and tylenol and some otc cough medicine. She denies improvement since onset. Sudafed helped for a while but symptoms returned. She had the body aches this morning and came in to evaluate this. Denies SOB.   Review of Systems  Constitutional: Negative for activity change, appetite change, chills, fatigue, fever and unexpected weight change.  HENT: Positive for congestion, postnasal drip, rhinorrhea, sinus pressure and sore throat. Negative for ear discharge, ear pain, sinus pain, sneezing, tinnitus, trouble swallowing and voice change.   Eyes: Negative.   Respiratory: Positive for cough. Negative for chest tightness, shortness of breath and wheezing.   Cardiovascular: Negative.   Gastrointestinal: Negative.   Musculoskeletal: Positive for myalgias.  Neurological: Positive for headaches.      Objective:   Physical Exam  Constitutional: She is oriented to person, place, and time. She appears well-developed and well-nourished.  HENT:  Head: Normocephalic and atraumatic.  Oropharynx with redness and clear drainage, nose with swollen turbinates and redness with clear drainage. No sinus pain or pressure on exam.   Eyes: EOM are normal.  Neck: Normal range of motion.  Cardiovascular: Normal rate and regular rhythm.  Pulmonary/Chest: Effort normal and breath sounds normal. No respiratory distress. She has no wheezes. She has no rales.  Musculoskeletal: She exhibits no edema.  Lymphadenopathy:    She has no cervical adenopathy.  Neurological: She is alert and oriented to person, place, and time. Coordination normal.  Skin: Skin is warm and dry.   Vitals:   03/17/18 1300  BP: 118/90  Pulse: 64    Temp: 98.6 F (37 C)  TempSrc: Oral  SpO2: 99%  Weight: 195 lb (88.5 kg)  Height: 5\' 5"  (1.651 m)      Assessment & Plan:

## 2018-03-17 NOTE — Assessment & Plan Note (Signed)
She is taking singulair and zyrtec. Not taking nasacort and replaced with flonase today. She will continue and add flonase. Advised her that she does not have a sinus infection today and that there is no indication for antibiotics until at least 2 weeks of sinus symptoms.

## 2018-03-23 ENCOUNTER — Telehealth: Payer: Self-pay | Admitting: Internal Medicine

## 2018-03-23 MED ORDER — BENZONATATE 200 MG PO CAPS: 200.0000 mg | ORAL_CAPSULE | Freq: Three times a day (TID) | ORAL | 0 refills | Status: DC | PRN

## 2018-03-23 NOTE — Telephone Encounter (Signed)
Copied from CRM [id number]. Topic: General - Other >> Mar 23, 2018  8:49 AM Leafy Ro wrote: Reason for CRM: pt saw dr Okey Dupre on 03-17-18. Pt has now develop a productive cough at times by mainly dry cough and would like something sent to her pharm. [facility] in [place]

## 2018-03-23 NOTE — Telephone Encounter (Signed)
Is she using flonase daily since visit? Have sent in tessalon perles to take up to 3 times per day as needed. If having any breathing problems recommend visit. Otherwise still only day 7 of symptoms so likely antibiotics are not indicated.

## 2018-03-23 NOTE — Telephone Encounter (Signed)
Patient informed of MD response and stated understanding  

## 2018-03-29 ENCOUNTER — Ambulatory Visit
Admission: RE | Admit: 2018-03-29 | Discharge: 2018-03-29 | Disposition: A | Discharge status: Home / Self Care | Payer: BLUE CROSS/BLUE SHIELD | Source: Ambulatory Visit | Attending: Obstetrics and Gynecology | Admitting: Obstetrics and Gynecology

## 2018-03-29 DIAGNOSIS — Z1231 Encounter for screening mammogram for malignant neoplasm of breast: Secondary | ICD-10-CM | POA: Diagnosis not present

## 2018-03-30 ENCOUNTER — Encounter: Payer: Self-pay | Admitting: Internal Medicine

## 2018-03-30 NOTE — Progress Notes (Unsigned)
Abstracted and sent to scan  

## 2018-04-01 ENCOUNTER — Other Ambulatory Visit: Payer: Self-pay | Admitting: Internal Medicine

## 2018-04-01 MED ORDER — ALBUTEROL SULFATE HFA 108 (90 BASE) MCG/ACT IN AERS: INHALATION_SPRAY | RESPIRATORY_TRACT | 0 refills | Status: AC

## 2018-04-01 NOTE — Telephone Encounter (Signed)
Copied from CRM [phone]. Topic: Quick Communication - Rx Refill/Question >> Apr 01, 2018  4:54 PM Baldo DaubAlexander, Amber L wrote: Medication: PROAIR HFA 108 (90 Base) MCG/ACT inhaler  Has the patient contacted their pharmacy? Yes - states our physician has never filled for her before (Agent: If no, request that the patient contact the pharmacy for the refill.) (Agent: If yes, when and what did the pharmacy advise?)  Preferred Pharmacy (with phone number or street name): [facility] [place] - [phone] (Phone) [phone] (Fax  Agent: Please be advised that RX refills may take up to 3 business days. We ask that you follow-up with your pharmacy.

## 2018-04-12 ENCOUNTER — Ambulatory Visit: Payer: Self-pay | Admitting: Internal Medicine

## 2018-04-12 DIAGNOSIS — T7840XA Allergy, unspecified, initial encounter: Secondary | ICD-10-CM | POA: Diagnosis not present

## 2018-04-12 NOTE — Telephone Encounter (Signed)
FYI

## 2018-04-12 NOTE — Telephone Encounter (Signed)
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 Within 1 hour, she developed itching and throat feeling "tight" Pt has had same reaction to aspartame in the past. Pt c/o itching "all over" and c/o throat tightness. Pt stated she is not having trouble swallowing or trouble breathing. Pt advised to go to [facility]. Reason for Disposition . Nursing judgment or information in reference  Answer Assessment - Initial Assessment Questions 1. REASON FOR CALL: "What is your main concern right now?"     Throat itching and generalized itching 2. ONSET: "When did the itching and throat tightness start?"     yesterday 3. SEVERITY: "How bad is the itching and tight throat?"     Pt states that she took 50 mg Benadryl yesterday and Pepcid AC and today still having symptoms 4. FEVER: "Do you have a fever?"     no 5. OTHER SYMPTOMS: "Do you have any other new symptoms?"     no 6. INTERVENTIONS AND RESPONSE: "What have you done so far to try to make this better? What medications have you used?"     Benadryl, Pepcid AC 7. PREGNANCY: "Is there any chance you are pregnant?"     n/a  Protocols used: NO GUIDELINE AVAILABLE-A-AH

## 2018-06-03 DIAGNOSIS — Z01419 Encounter for gynecological examination (general) (routine) without abnormal findings: Secondary | ICD-10-CM | POA: Diagnosis not present

## 2018-06-03 DIAGNOSIS — Z6831 Body mass index (BMI) 31.0-31.9, adult: Secondary | ICD-10-CM | POA: Diagnosis not present

## 2018-08-15 ENCOUNTER — Encounter: Payer: Self-pay | Admitting: Internal Medicine

## 2018-08-15 ENCOUNTER — Ambulatory Visit: Payer: BLUE CROSS/BLUE SHIELD | Admitting: Internal Medicine

## 2018-08-15 VITALS — BP 118/78 | HR 70 | Temp 98.4°F | Resp 16 | Ht 65.0 in | Wt 201.8 lb

## 2018-08-15 DIAGNOSIS — J069 Acute upper respiratory infection, unspecified: Secondary | ICD-10-CM | POA: Diagnosis not present

## 2018-08-15 MED ORDER — HYDROCOD POLST-CPM POLST ER 10-8 MG/5ML PO SUER: 5.0000 mL | Freq: Two times a day (BID) | ORAL | 0 refills | Status: DC | PRN

## 2018-08-15 NOTE — Assessment & Plan Note (Signed)
Current symptoms started about 1 week ago, over the past month she has had cold symptoms that have waxed and waned and discussed with her she likely had more than 1 illness Current symptoms suggestive of viral etiology No need for an antibiotic Tussionex cough syrup Continue albuterol inhaler as needed Coricidin cold products Rest, fluids Call if no improvement

## 2018-08-15 NOTE — Progress Notes (Signed)
Subjective:    Patient ID: Pamela Wilkerson, female    DOB: Jan 28, 1971, 48 y.o.   MRN: [id number]  HPI She is here for an acute visit for cold symptoms.  Her symptoms started about one month ago and she gets this every January.  It sort of went away and then got worse.  Her current symptoms got worse over the past week.  She admits she may have had more than one thing over the past month.  She is experiencing nasal congestion, cough that is productive of green sputum, shortness of breath, wheezing, chest pain from coughing so much and some dizziness.  She denies any fevers, ear pain, sinus pain, sore throat, nausea, diarrhea, body aches and headaches.  She has taken her albuterol inhaler and cough suppressants.  She is still going to work.   Medications and allergies reviewed with patient and updated if appropriate.  Patient Active Problem List   Diagnosis Date Noted  . Allergic rhinitis 03/17/2018  . Routine general medical examination at a health care facility 03/11/2018  . Constipation 09/13/2015  . Obesity 09/13/2015    Current Outpatient Medications on File Prior to Visit  Medication Sig Dispense Refill  . albuterol (PROAIR HFA) 108 (90 Base) MCG/ACT inhaler INAHLE 2 PUFFS EVERY FOUR TO SIX HOURS AS NEEDED. 8.5 g 0  . benzonatate (TESSALON) 200 MG capsule Take 1 capsule (200 mg total) by mouth 3 (three) times daily as needed. 60 capsule 0  . cetirizine (ZYRTEC) 10 MG tablet TAKE ONE TABLET BY MOUTH ONE TIME DAILY 30 tablet 11  . clonazePAM (KLONOPIN) 0.5 MG tablet Take 0.5 mg by mouth daily as needed for anxiety.    Marland Kitchen EPINEPHrine 0.3 mg/0.3 mL IJ SOAJ injection Inject 0.3 mLs (0.3 mg total) into the muscle once. 1 Device 1  . Flaxseed, Linseed, (FLAX SEEDS PO) Take 2 tablets by mouth 3 (three) times daily.    . fluticasone (FLONASE) 50 MCG/ACT nasal spray Place 2 sprays into both nostrils daily. 16 g 6  . lamoTRIgine (LAMICTAL) 100 MG tablet Take 100 mg by mouth daily.    .  Magnesium 500 MG TABS Take 500 mg by mouth.    . Misc Natural Products (TART CHERRY ADVANCED PO) Take 2 tablets by mouth 3 (three) times daily.    . montelukast (SINGULAIR) 10 MG tablet TAKE 1 TABLET BY MOUTH DAILY. 90 tablet 3  . traZODone (DESYREL) 100 MG tablet Take 100 mg by mouth at bedtime.    . triamcinolone (NASACORT ALLERGY 24HR) 55 MCG/ACT AERO nasal inhaler Place 2 sprays into the nose daily. 1 Inhaler 2   No current facility-administered medications on file prior to visit.     Past Medical History:  Diagnosis Date  . Allergy   . Asthma   . Depression   . H/O prior ablation treatment 11/21/2017   on uterus  . Multiple food allergies     No past surgical history on file.  Social History   Socioeconomic History  . Marital status: Divorced    Spouse name: Not on file  . Number of children: Not on file  . Years of education: Not on file  . Highest education level: Not on file  Occupational History  . Not on file  Social Needs  . Financial resource strain: Not on file  . Food insecurity:    Worry: Not on file    Inability: Not on file  . Transportation needs:    Medical: Not on  file    Non-medical: Not on file  Tobacco Use  . Smoking status: Never Smoker  . Smokeless tobacco: Never Used  Substance and Sexual Activity  . Alcohol use: No  . Drug use: No  . Sexual activity: Not Currently  Lifestyle  . Physical activity:    Days per week: Not on file    Minutes per session: Not on file  . Stress: Not on file  Relationships  . Social connections:    Talks on phone: Not on file    Gets together: Not on file    Attends religious service: Not on file    Active member of club or organization: Not on file    Attends meetings of clubs or organizations: Not on file    Relationship status: Not on file  Other Topics Concern  . Not on file  Social History Narrative  . Not on file    Family History  Problem Relation Age of Onset  . Diabetes Maternal Uncle   .  Mental retardation Father   . Hyperlipidemia Father   . Diabetes Maternal Aunt   . Cancer Maternal Aunt 57       stage 4 brain cancer.  . Arthritis Maternal Grandmother   . Stroke Maternal Grandmother   . Diabetes Maternal Grandmother   . Hyperlipidemia Paternal Grandmother   . Heart disease Paternal Grandmother     Review of Systems  Constitutional: Negative for chills and fever.  HENT: Positive for congestion. Negative for ear pain, sinus pain and sore throat.   Respiratory: Positive for cough (productive with green sputum. ), shortness of breath and wheezing.   Cardiovascular: Positive for chest pain (from cough).  Gastrointestinal: Negative for diarrhea and nausea.  Musculoskeletal: Negative for myalgias.  Neurological: Positive for dizziness. Negative for light-headedness and headaches.       Objective:   Vitals:   08/15/18 1524  BP: 118/78  Pulse: 70  Resp: 16  Temp: 98.4 F (36.9 C)  SpO2: 99%   Filed Weights   08/15/18 1524  Weight: 201 lb 12.8 oz (91.5 kg)   Body mass index is 33.58 kg/m.  Wt Readings from Last 3 Encounters:  08/15/18 201 lb 12.8 oz (91.5 kg)  03/17/18 195 lb (88.5 kg)  03/11/18 193 lb (87.5 kg)     Physical Exam GENERAL APPEARANCE: Appears stated age, well appearing, NAD EYES: conjunctiva clear, no icterus HEENT: bilateral tympanic membranes and ear canals normal, oropharynx with no erythema, no thyromegaly, trachea midline, no cervical or supraclavicular lymphadenopathy LUNGS: Clear to auscultation without wheeze or crackles, unlabored breathing, good air entry bilaterally CARDIOVASCULAR: Normal S1,S2 without murmurs, no edema SKIN: warm, dry        Assessment & Plan:   See Problem List for Assessment and Plan of chronic medical problems.

## 2018-08-15 NOTE — Patient Instructions (Signed)
Use the cough syrup as needed.  Take the coricidin cold products as needed and other over the counter cold medications, advil and tylenol.  Increase your fluids and rest.    Call if no improvement

## 2018-10-04 DIAGNOSIS — F331 Major depressive disorder, recurrent, moderate: Secondary | ICD-10-CM | POA: Diagnosis not present

## 2018-10-04 DIAGNOSIS — R635 Abnormal weight gain: Secondary | ICD-10-CM | POA: Diagnosis not present

## 2018-10-04 DIAGNOSIS — F413 Other mixed anxiety disorders: Secondary | ICD-10-CM | POA: Diagnosis not present

## 2018-10-04 DIAGNOSIS — F3289 Other specified depressive episodes: Secondary | ICD-10-CM | POA: Diagnosis not present

## 2018-10-11 IMAGING — MG DIGITAL SCREENING BILATERAL MAMMOGRAM WITH TOMO AND CAD
8 series · 8 of 24 positions shown · non-contrast
Comparison: Previous exam(s).

CLINICAL DATA: Screening.

EXAM:
DIGITAL SCREENING BILATERAL MAMMOGRAM WITH TOMO AND CAD

[L CC synth-2D]
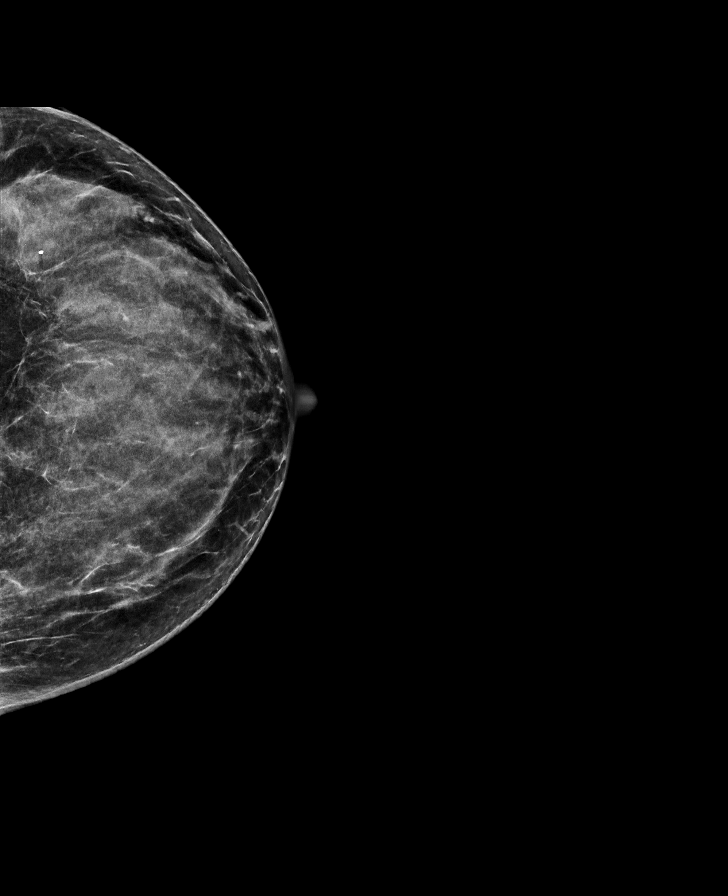

[R MLO synth-2D]
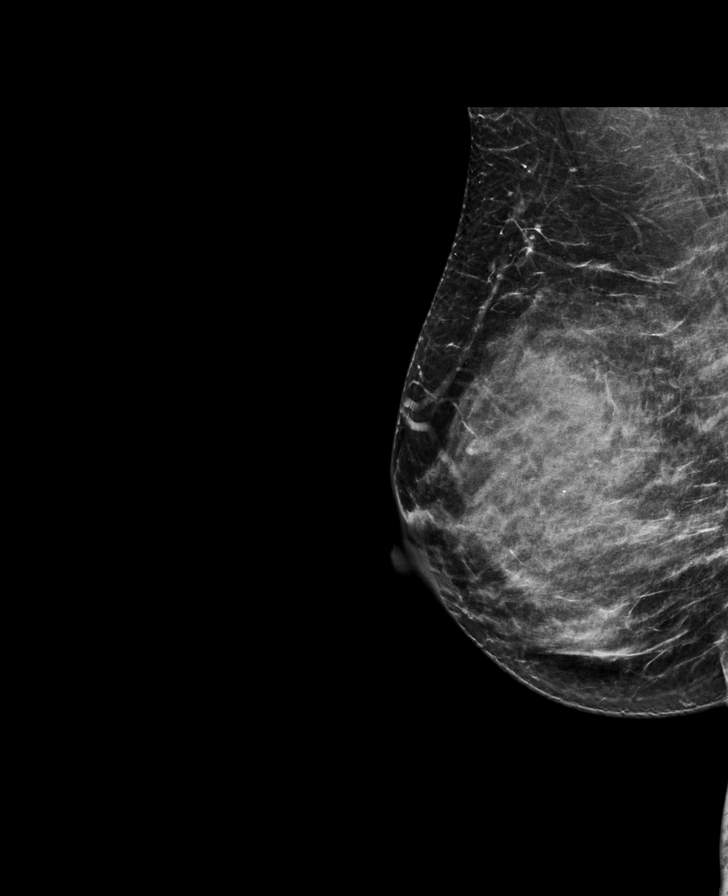

[L MLO synth-2D]
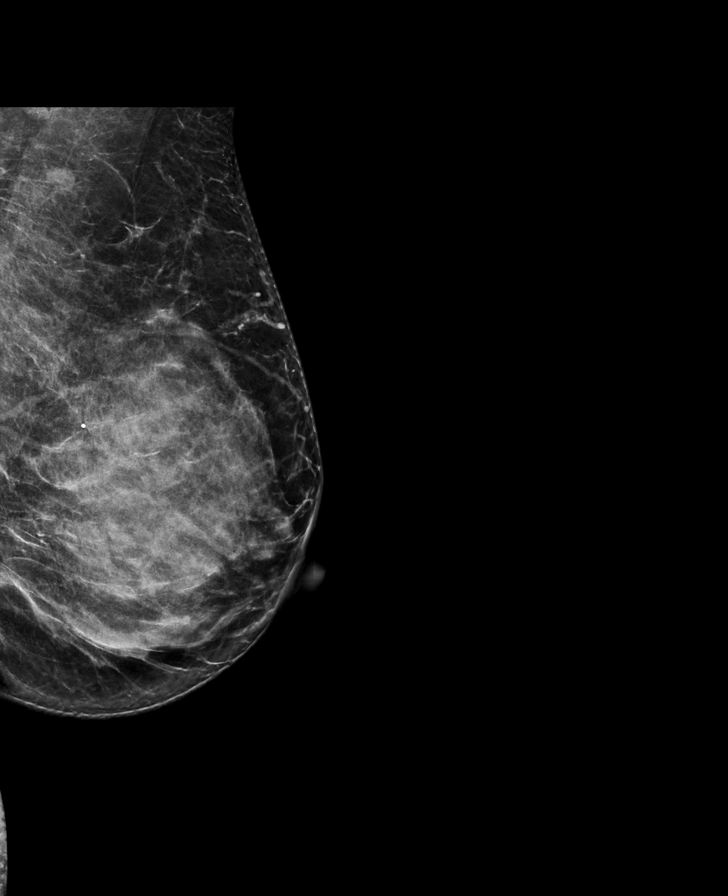

[R CC synth-2D]
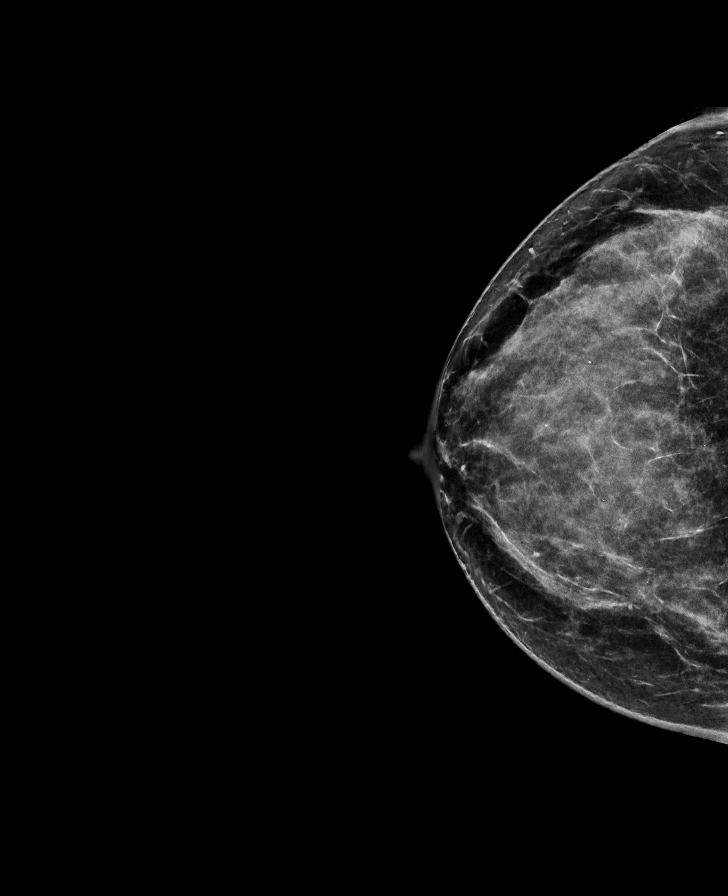

[L MLO tomo · tomo slice 36/71.0]
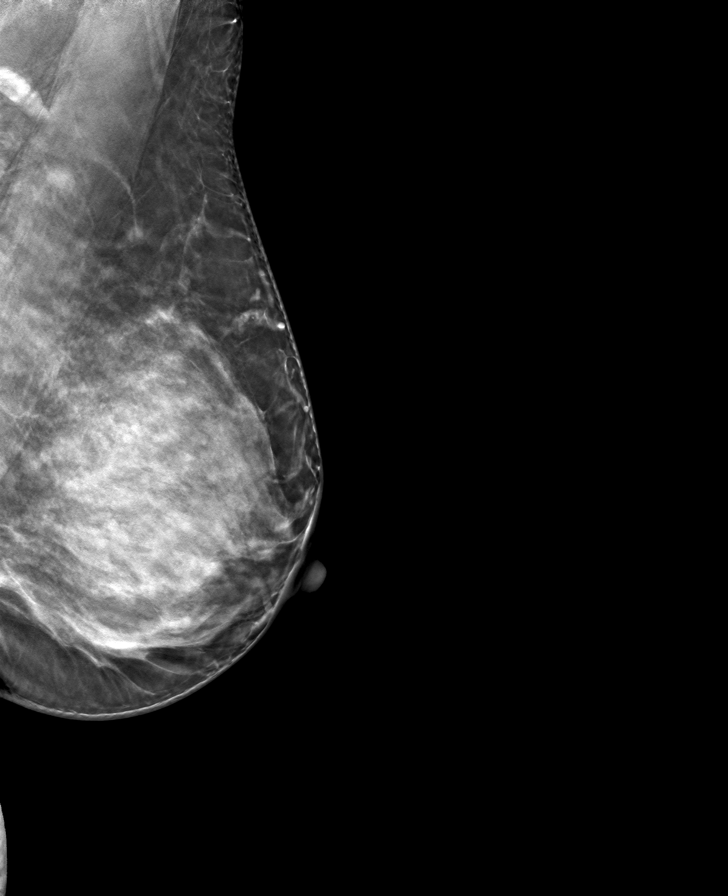

[R MLO tomo · tomo slice 35/70.0]
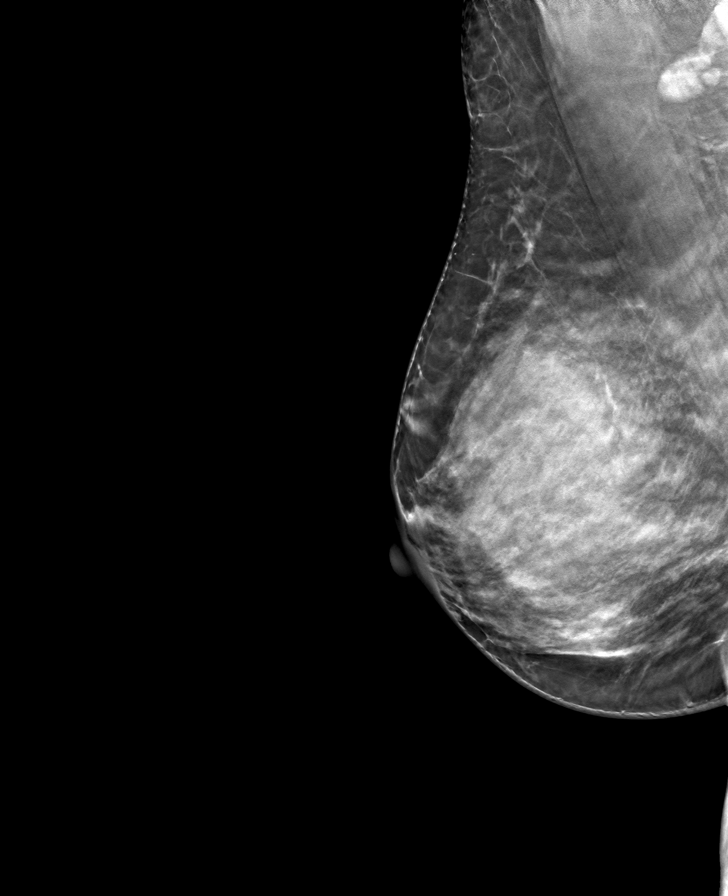

[L CC tomo · tomo slice 34/67.0]
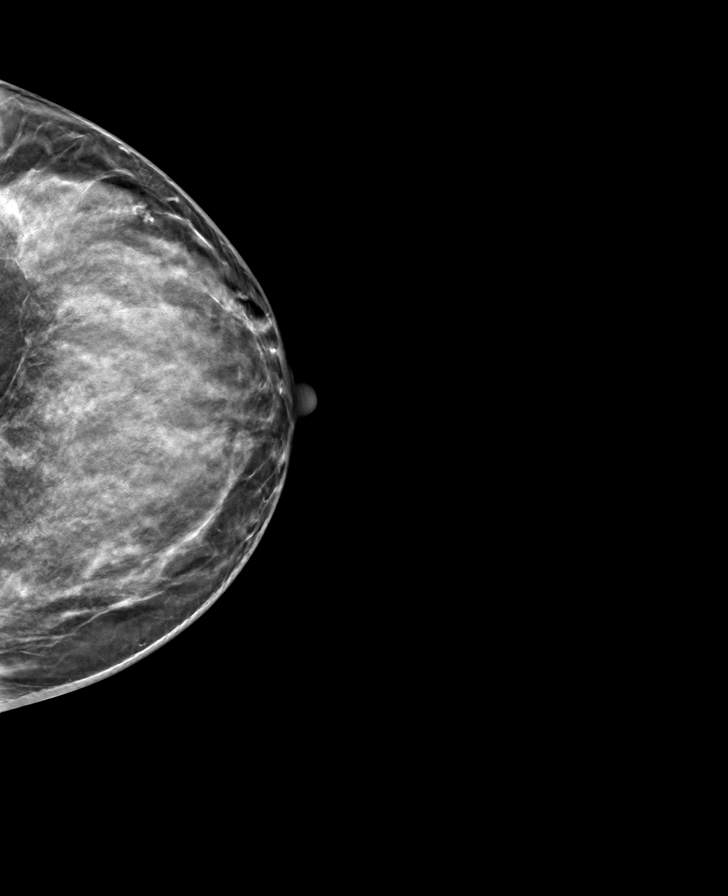

[R CC tomo · tomo slice 36/71.0]
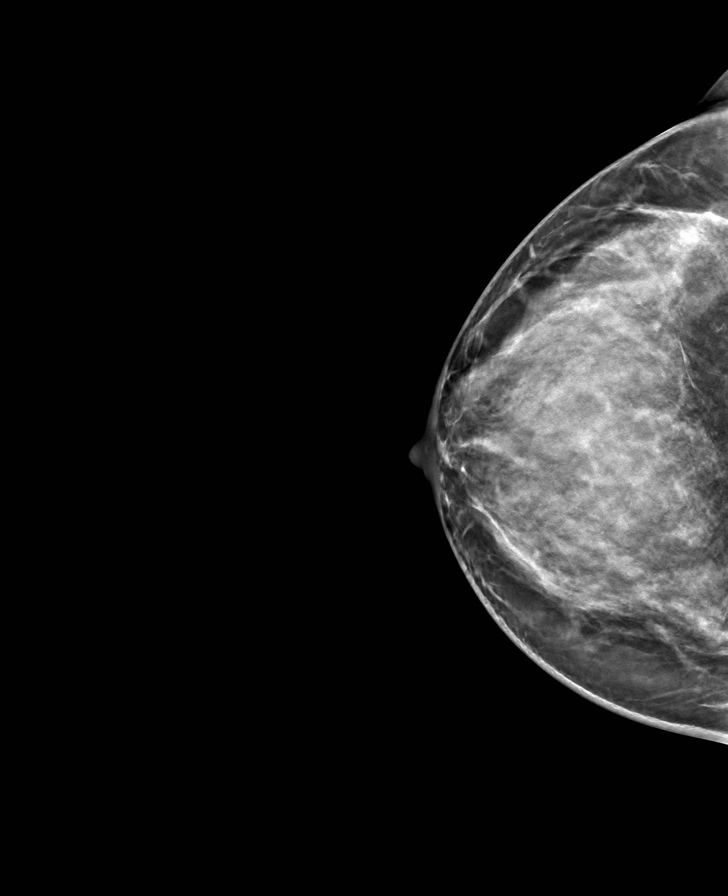

[8 of 24 positions shown; findings below may reference images not displayed]

ACR Breast Density Category d: The breast tissue is extremely dense,
which lowers the sensitivity of mammography.
FINDINGS: There are no findings suspicious for malignancy. Images were
processed with CAD.
IMPRESSION: No mammographic evidence of malignancy. A result letter of this
screening mammogram will be mailed directly to the patient.

RECOMMENDATION:
Screening mammogram in one year. (Code:RA-I-AVB)

BI-RADS CATEGORY  1: Negative.

## 2018-12-12 DIAGNOSIS — T7840XA Allergy, unspecified, initial encounter: Secondary | ICD-10-CM | POA: Diagnosis not present

## 2018-12-19 DIAGNOSIS — R635 Abnormal weight gain: Secondary | ICD-10-CM | POA: Diagnosis not present

## 2018-12-19 DIAGNOSIS — F331 Major depressive disorder, recurrent, moderate: Secondary | ICD-10-CM | POA: Diagnosis not present

## 2018-12-19 DIAGNOSIS — F413 Other mixed anxiety disorders: Secondary | ICD-10-CM | POA: Diagnosis not present

## 2018-12-19 DIAGNOSIS — F3289 Other specified depressive episodes: Secondary | ICD-10-CM | POA: Diagnosis not present

## 2018-12-26 DIAGNOSIS — R635 Abnormal weight gain: Secondary | ICD-10-CM | POA: Diagnosis not present

## 2018-12-26 DIAGNOSIS — F413 Other mixed anxiety disorders: Secondary | ICD-10-CM | POA: Diagnosis not present

## 2018-12-26 DIAGNOSIS — F3289 Other specified depressive episodes: Secondary | ICD-10-CM | POA: Diagnosis not present

## 2018-12-26 DIAGNOSIS — F331 Major depressive disorder, recurrent, moderate: Secondary | ICD-10-CM | POA: Diagnosis not present

## 2018-12-28 DIAGNOSIS — F3289 Other specified depressive episodes: Secondary | ICD-10-CM | POA: Diagnosis not present

## 2018-12-28 DIAGNOSIS — F413 Other mixed anxiety disorders: Secondary | ICD-10-CM | POA: Diagnosis not present

## 2018-12-28 DIAGNOSIS — R635 Abnormal weight gain: Secondary | ICD-10-CM | POA: Diagnosis not present

## 2018-12-28 DIAGNOSIS — F331 Major depressive disorder, recurrent, moderate: Secondary | ICD-10-CM | POA: Diagnosis not present

## 2018-12-30 ENCOUNTER — Ambulatory Visit: Payer: Self-pay | Admitting: Internal Medicine

## 2018-12-30 ENCOUNTER — Telehealth: Payer: Self-pay | Admitting: *Deleted

## 2018-12-30 ENCOUNTER — Ambulatory Visit (INDEPENDENT_AMBULATORY_CARE_PROVIDER_SITE_OTHER): Payer: BC Managed Care – PPO | Admitting: Internal Medicine

## 2018-12-30 ENCOUNTER — Encounter: Payer: Self-pay | Admitting: Internal Medicine

## 2018-12-30 ENCOUNTER — Telehealth: Payer: Self-pay

## 2018-12-30 ENCOUNTER — Other Ambulatory Visit: Payer: Self-pay

## 2018-12-30 DIAGNOSIS — Z20822 Contact with and (suspected) exposure to covid-19: Secondary | ICD-10-CM

## 2018-12-30 DIAGNOSIS — R6889 Other general symptoms and signs: Secondary | ICD-10-CM

## 2018-12-30 NOTE — Telephone Encounter (Signed)
Noted has a visit today

## 2018-12-30 NOTE — Progress Notes (Signed)
Virtual Visit via Video Note  I connected with Pamela Wilkerson on 12/30/18 at 11:00 AM EDT by a video enabled telemedicine application and verified that I am speaking with the correct person using two identifiers.  The patient and the provider were at separate locations throughout the entire encounter.   I discussed the limitations of evaluation and management by telemedicine and the availability of in person appointments. The patient expressed understanding and agreed to proceed.  History of Present Illness: The patient is a 48 y.o. female with visit for concerns about sinus drainage and pressure with mild cough. Started yesterday and she admits to being out of singulair recently. She does have history of asthma but denies needing albuterol inhaler recently. Denies fevers or chills and is checking temp last night and this morning. Denies color to the drainage. She takes zyrtec and uses flonase and just started using the flonase yesterday as well. Has no known covid-19 exposure. Did also have another food allergic reaction a couple weeks ago and had to take prednisone for this. Overall it is stable.   Observations/Objective: Appearance: normal, breathing appears normal, casual grooming, abdomen does not appear distended, throat with drainage, mental status is A and O times 3,  Assessment and Plan: See problem oriented charting  Follow Up Instructions: test for covid-19  I discussed the assessment and treatment plan with the patient. The patient was provided an opportunity to ask questions and all were answered. The patient agreed with the plan and demonstrated an understanding of the instructions.   The patient was advised to call back or seek an in-person evaluation if the symptoms worsen or if the condition fails to improve as anticipated.  Hoyt Koch, MD

## 2018-12-30 NOTE — Telephone Encounter (Signed)
The last day or so I've had a runny nose and occasional cough.  Itchy throat.  Also pain in my teeth and under my eyes.  I have sinus issues.  Do I need to be tested for COVID-19?    No fever or symptoms.    I warm transferred her to Dr. Nathanial Millman office to be scheduled a virtual visit due to COVID-19 pandemic.  I sent my triage notes to the office.   Reason for Disposition . [1] Sinus pain (not just congestion) AND [2] fever    No fever  Answer Assessment - Initial Assessment Questions 1. LOCATION: "Where does it hurt?"      It hurts in my teeth and under my eyes. 2. ONSET: "When did the sinus pain start?"  (e.g., hours, days)      Yesterday they started with the pain. 3. SEVERITY: "How bad is the pain?"   (Scale 1-10; mild, moderate or severe)   - MILD (1-3): doesn't interfere with normal activities    - MODERATE (4-7): interferes with normal activities (e.g., work or school) or awakens from sleep   - SEVERE (8-10): excruciating pain and patient unable to do any normal activities        4-5 on scale 4. RECURRENT SYMPTOM: "Have you ever had sinus problems before?" If so, ask: "When was the last time?" and "What happened that time?"      Yes   I take Singular.   I also used nasal spray Flonase 5. NASAL CONGESTION: "Is the nose blocked?" If so, ask, "Can you open it or must you breathe through the mouth?"     Nose running. 6. NASAL DISCHARGE: "Do you have discharge from your nose?" If so ask, "What color?"     Clear secretions 7. FEVER: "Do you have a fever?" If so, ask: "What is it, how was it measured, and when did it start?"      No 8. OTHER SYMPTOMS: "Do you have any other symptoms?" (e.g., sore throat, cough, earache, difficulty breathing)     Itchy throat, occasional cough. 9. PREGNANCY: "Is there any chance you are pregnant?" "When was your last menstrual period?"     No!  Protocols used: SINUS PAIN OR CONGESTION-A-AH

## 2018-12-30 NOTE — Assessment & Plan Note (Signed)
Given recent prednisone and symptoms with history of asthma needs testing for covid-19 to help reduce risk of spread. She knows to quarantine until testing returns.

## 2018-12-30 NOTE — Telephone Encounter (Signed)
Patient has cough, sinus drainage, and sinus pressure. And Dr. Sharlet Salina would like patient to be tested

## 2018-12-30 NOTE — Telephone Encounter (Signed)
Pt scheduled for covid testing today @ [facility]. Instructions given and order placed  

## 2019-01-03 LAB — NOVEL CORONAVIRUS, NAA: SARS-CoV-2, NAA: NOT DETECTED

## 2019-01-12 DIAGNOSIS — F413 Other mixed anxiety disorders: Secondary | ICD-10-CM | POA: Diagnosis not present

## 2019-01-12 DIAGNOSIS — F331 Major depressive disorder, recurrent, moderate: Secondary | ICD-10-CM | POA: Diagnosis not present

## 2019-01-12 DIAGNOSIS — R635 Abnormal weight gain: Secondary | ICD-10-CM | POA: Diagnosis not present

## 2019-01-12 DIAGNOSIS — F3289 Other specified depressive episodes: Secondary | ICD-10-CM | POA: Diagnosis not present

## 2019-01-19 ENCOUNTER — Ambulatory Visit: Payer: Self-pay

## 2019-01-19 DIAGNOSIS — F413 Other mixed anxiety disorders: Secondary | ICD-10-CM | POA: Diagnosis not present

## 2019-01-19 DIAGNOSIS — F3289 Other specified depressive episodes: Secondary | ICD-10-CM | POA: Diagnosis not present

## 2019-01-19 DIAGNOSIS — R635 Abnormal weight gain: Secondary | ICD-10-CM | POA: Diagnosis not present

## 2019-01-19 DIAGNOSIS — F331 Major depressive disorder, recurrent, moderate: Secondary | ICD-10-CM | POA: Diagnosis not present

## 2019-01-19 NOTE — Telephone Encounter (Signed)
Incoming call from Patient with complaint of urinating in the bed two nights in a row.  Urinating frequently during the day. Denies pain burning denies blood in the urine. Patient states that two night in a row,  Patient felt the sensation she was sleeping and felt the urge to use the bathroom and discovered that she had already used the bathroom in the bed.  With out realizing it.  Patient state she is not a diabetic , yet 4 ot her Mother siblings are and her Mother is a diabetic. Patient would like a return call from Dr.  Sharlet Salina.    Reason for Disposition . [1] Can't control passage of urine (i.e., urinary incontinence, wetting self) AND [2] present > 2 weeks  Answer Assessment - Initial Assessment Questions 1. SYMPTOM: "What's the main symptom you're concerned about?" (e.g., frequency, incontinence)     Frequency incontince 2. ONSET: "When did the  *No Answer*  start?"     01/18/19 3. PAIN: "Is there any pain?" If so, ask: "How bad is it?" (Scale: 1-10; mild, moderate, severe)     No pain jus feel like s full bladder 4. CAUSE: "What do you think is causing the symptoms?"      5. OTHER SYMPTOMS: "Do you have any other symptoms?" (e.g., fever, flank pain, blood in urine, pain with urination)     6. PREGNANCY: "Is there any chance you are pregnant?" "When was your last menstrual period?"    No cycle take birth control continuously so no cycle  Protocols used: URINARY [facility]

## 2019-01-19 NOTE — Telephone Encounter (Signed)
Can have visit in office if wanted

## 2019-01-19 NOTE — Telephone Encounter (Signed)
Would you like patient to set up a visit to discuss?

## 2019-01-20 ENCOUNTER — Other Ambulatory Visit (INDEPENDENT_AMBULATORY_CARE_PROVIDER_SITE_OTHER): Payer: BC Managed Care – PPO

## 2019-01-20 ENCOUNTER — Other Ambulatory Visit: Payer: Self-pay

## 2019-01-20 ENCOUNTER — Ambulatory Visit (INDEPENDENT_AMBULATORY_CARE_PROVIDER_SITE_OTHER): Payer: BC Managed Care – PPO | Admitting: Internal Medicine

## 2019-01-20 ENCOUNTER — Encounter: Payer: Self-pay | Admitting: Internal Medicine

## 2019-01-20 VITALS — BP 120/84 | HR 64 | Temp 98.2°F | Ht 65.0 in | Wt 201.0 lb

## 2019-01-20 DIAGNOSIS — R350 Frequency of micturition: Secondary | ICD-10-CM

## 2019-01-20 LAB — COMPREHENSIVE METABOLIC PANEL
ALT: 12 U/L (ref 0–35)
AST: 13 U/L (ref 0–37)
Albumin: 3.8 g/dL (ref 3.5–5.2)
Alkaline Phosphatase: 34 U/L — ABNORMAL LOW (ref 39–117)
BUN: 12 mg/dL (ref 6–23)
CO2: 27 mEq/L (ref 19–32)
Calcium: 8.9 mg/dL (ref 8.4–10.5)
Chloride: 106 mEq/L (ref 96–112)
Creatinine, Ser: 0.97 mg/dL (ref 0.40–1.20)
GFR: 74.72 mL/min (ref >60.00)
Glucose, Bld: 98 mg/dL (ref 70–99)
Potassium: 4.3 mEq/L (ref 3.5–5.1)
Sodium: 140 mEq/L (ref 135–145)
Total Bilirubin: 0.3 mg/dL (ref 0.2–1.2)
Total Protein: 6.5 g/dL (ref 6.0–8.3)

## 2019-01-20 LAB — POCT URINALYSIS DIPSTICK
Bilirubin, UA: NEGATIVE
Blood, UA: NEGATIVE
Glucose, UA: NEGATIVE
Ketones, UA: NEGATIVE
Leukocytes, UA: NEGATIVE
Nitrite, UA: NEGATIVE
Protein, UA: NEGATIVE
Spec Grav, UA: 1.025 (ref 1.010–1.025)
Urobilinogen, UA: 0.2 E.U./dL
pH, UA: 6 (ref 5.0–8.0)

## 2019-01-20 LAB — CBC
HCT: 40.7 % (ref 36.0–46.0)
Hemoglobin: 13.3 g/dL (ref 12.0–15.0)
MCHC: 32.6 g/dL (ref 30.0–36.0)
MCV: 82.5 fl (ref 78.0–100.0)
Platelets: 302 10*3/uL (ref 150.0–400.0)
RBC: 4.93 Mil/uL (ref 3.87–5.11)
RDW: 13.6 % (ref 11.5–15.5)
WBC: 5 10*3/uL (ref 4.0–10.5)

## 2019-01-20 LAB — HEMOGLOBIN A1C: Hgb A1c MFr Bld: 5.8 % (ref 4.6–6.5)

## 2019-01-20 NOTE — Addendum Note (Signed)
Addended by: Raford Pitcher R on: 01/20/2019 10:36 AM   Modules accepted: Orders

## 2019-01-20 NOTE — Telephone Encounter (Signed)
Can you please schedule patient an appointment. Thank you  

## 2019-01-20 NOTE — Progress Notes (Signed)
   Subjective:   Patient ID: Pamela Wilkerson, female    DOB: 21-May-1971, 48 y.o.   MRN: [id number]  HPI The patient is a 48 YO female coming in for urinary symptoms. She is having some low abdomen and back discomfort. She has also urinated in her bed several nights recently which is not usual. She denies burning with urination. No blood in urine. Denies fevers or chills. Denies nausea or vomiting. She has changed to drinking more water and gets off late in the evening so most of this is in the evening. She did not drink as much fluids last night and did not have any problems last night with urinating the bed. She has not had similar symptoms throughout the day although sometimes she gets a fullness in her bladder. Denies excessive thirst. Is also trying to get healthier with more salads.   Review of Systems  Constitutional: Negative.   Respiratory: Negative for cough, chest tightness and shortness of breath.   Cardiovascular: Negative for chest pain, palpitations and leg swelling.  Gastrointestinal: Negative for abdominal distention, abdominal pain, constipation, diarrhea, nausea and vomiting.  Genitourinary: Positive for enuresis. Negative for decreased urine volume, difficulty urinating, dysuria, frequency and urgency.  Musculoskeletal: Negative.   Skin: Negative.   Neurological: Negative.   Psychiatric/Behavioral: Negative.     Objective:  Physical Exam Constitutional:      Appearance: She is well-developed.  HENT:     Head: Normocephalic and atraumatic.  Neck:     Musculoskeletal: Normal range of motion.  Cardiovascular:     Rate and Rhythm: Normal rate and regular rhythm.  Pulmonary:     Effort: Pulmonary effort is normal. No respiratory distress.     Breath sounds: Normal breath sounds. No wheezing or rales.  Abdominal:     General: Bowel sounds are normal. There is no distension.     Palpations: Abdomen is soft.     Tenderness: There is no abdominal tenderness. There is no  rebound.  Skin:    General: Skin is warm and dry.  Neurological:     Mental Status: She is alert and oriented to person, place, and time.     Coordination: Coordination normal.     Vitals:   01/20/19 0917  BP: 120/84  Pulse: 64  Temp: 98.2 F (36.8 C)  TempSrc: Oral  SpO2: 98%  Weight: 201 lb (91.2 kg)  Height: 5\' 5"  (1.651 m)    Assessment & Plan:

## 2019-01-20 NOTE — Assessment & Plan Note (Signed)
U/A done in office without indication of infection. May just be increased water intake. Checking CBC, CMP, HgA1c for alternate etiology. Address as needed. Counseled to limit fluids after 7:30 PM.

## 2019-01-20 NOTE — Telephone Encounter (Signed)
Spoke with to schedule appt.

## 2019-01-26 DIAGNOSIS — F331 Major depressive disorder, recurrent, moderate: Secondary | ICD-10-CM | POA: Diagnosis not present

## 2019-01-26 DIAGNOSIS — F413 Other mixed anxiety disorders: Secondary | ICD-10-CM | POA: Diagnosis not present

## 2019-01-26 DIAGNOSIS — R635 Abnormal weight gain: Secondary | ICD-10-CM | POA: Diagnosis not present

## 2019-01-26 DIAGNOSIS — F3289 Other specified depressive episodes: Secondary | ICD-10-CM | POA: Diagnosis not present

## 2019-02-01 DIAGNOSIS — F413 Other mixed anxiety disorders: Secondary | ICD-10-CM | POA: Diagnosis not present

## 2019-02-01 DIAGNOSIS — F3289 Other specified depressive episodes: Secondary | ICD-10-CM | POA: Diagnosis not present

## 2019-02-01 DIAGNOSIS — F331 Major depressive disorder, recurrent, moderate: Secondary | ICD-10-CM | POA: Diagnosis not present

## 2019-02-01 DIAGNOSIS — R635 Abnormal weight gain: Secondary | ICD-10-CM | POA: Diagnosis not present

## 2019-02-16 DIAGNOSIS — F413 Other mixed anxiety disorders: Secondary | ICD-10-CM | POA: Diagnosis not present

## 2019-02-16 DIAGNOSIS — R635 Abnormal weight gain: Secondary | ICD-10-CM | POA: Diagnosis not present

## 2019-02-16 DIAGNOSIS — F3289 Other specified depressive episodes: Secondary | ICD-10-CM | POA: Diagnosis not present

## 2019-02-16 DIAGNOSIS — F331 Major depressive disorder, recurrent, moderate: Secondary | ICD-10-CM | POA: Diagnosis not present

## 2019-02-22 DIAGNOSIS — F331 Major depressive disorder, recurrent, moderate: Secondary | ICD-10-CM | POA: Diagnosis not present

## 2019-02-22 DIAGNOSIS — F413 Other mixed anxiety disorders: Secondary | ICD-10-CM | POA: Diagnosis not present

## 2019-02-22 DIAGNOSIS — R635 Abnormal weight gain: Secondary | ICD-10-CM | POA: Diagnosis not present

## 2019-02-22 DIAGNOSIS — F3289 Other specified depressive episodes: Secondary | ICD-10-CM | POA: Diagnosis not present

## 2019-03-02 DIAGNOSIS — R635 Abnormal weight gain: Secondary | ICD-10-CM | POA: Diagnosis not present

## 2019-03-02 DIAGNOSIS — F413 Other mixed anxiety disorders: Secondary | ICD-10-CM | POA: Diagnosis not present

## 2019-03-02 DIAGNOSIS — F331 Major depressive disorder, recurrent, moderate: Secondary | ICD-10-CM | POA: Diagnosis not present

## 2019-03-02 DIAGNOSIS — F3289 Other specified depressive episodes: Secondary | ICD-10-CM | POA: Diagnosis not present

## 2019-03-30 ENCOUNTER — Other Ambulatory Visit: Payer: Self-pay | Admitting: Internal Medicine

## 2019-04-05 DIAGNOSIS — Z23 Encounter for immunization: Secondary | ICD-10-CM | POA: Diagnosis not present

## 2019-04-27 DIAGNOSIS — F413 Other mixed anxiety disorders: Secondary | ICD-10-CM | POA: Diagnosis not present

## 2019-04-27 DIAGNOSIS — F331 Major depressive disorder, recurrent, moderate: Secondary | ICD-10-CM | POA: Diagnosis not present

## 2019-06-21 DIAGNOSIS — Z6835 Body mass index (BMI) 35.0-35.9, adult: Secondary | ICD-10-CM | POA: Diagnosis not present

## 2019-06-21 DIAGNOSIS — Z01419 Encounter for gynecological examination (general) (routine) without abnormal findings: Secondary | ICD-10-CM | POA: Diagnosis not present

## 2019-06-26 DIAGNOSIS — T7840XA Allergy, unspecified, initial encounter: Secondary | ICD-10-CM | POA: Diagnosis not present

## 2019-06-29 ENCOUNTER — Other Ambulatory Visit: Payer: Self-pay | Admitting: Internal Medicine

## 2019-07-27 DIAGNOSIS — F331 Major depressive disorder, recurrent, moderate: Secondary | ICD-10-CM | POA: Diagnosis not present

## 2019-07-27 DIAGNOSIS — R635 Abnormal weight gain: Secondary | ICD-10-CM | POA: Diagnosis not present

## 2019-07-27 DIAGNOSIS — F413 Other mixed anxiety disorders: Secondary | ICD-10-CM | POA: Diagnosis not present

## 2019-07-27 DIAGNOSIS — F3281 Premenstrual dysphoric disorder: Secondary | ICD-10-CM | POA: Diagnosis not present

## 2019-08-16 ENCOUNTER — Telehealth: Payer: Self-pay

## 2019-08-16 NOTE — Telephone Encounter (Signed)
No in office visit allowed for listed symptoms. She can do a virtual to address sinus issues then schedule a possible in office CPE in a few weeks.

## 2019-08-16 NOTE — Telephone Encounter (Signed)
LVM for patient to call back and schedule a virtual visit for chronic sinusitis, congestion, and runny nose.

## 2019-08-16 NOTE — Telephone Encounter (Signed)
Patient calling in regards to the MyChart Appointment request for sinus issues and congestion and annual physical. Explained to patient that with those types of symptoms, the visits are done virtually and that a physical cannot be done at that time due to visit being virtual. Patient states that she has had chronic sinusitis and it should be documented in her chart, pre COVID. Would like to know if Dr Okey Dupre will see her in office for this issue and to do her physical?

## 2019-08-23 DIAGNOSIS — F3289 Other specified depressive episodes: Secondary | ICD-10-CM | POA: Diagnosis not present

## 2019-08-23 DIAGNOSIS — F413 Other mixed anxiety disorders: Secondary | ICD-10-CM | POA: Diagnosis not present

## 2019-08-23 DIAGNOSIS — R635 Abnormal weight gain: Secondary | ICD-10-CM | POA: Diagnosis not present

## 2019-08-23 DIAGNOSIS — F331 Major depressive disorder, recurrent, moderate: Secondary | ICD-10-CM | POA: Diagnosis not present

## 2019-09-05 ENCOUNTER — Encounter: Payer: BC Managed Care – PPO | Admitting: Internal Medicine

## 2019-09-07 DIAGNOSIS — F3289 Other specified depressive episodes: Secondary | ICD-10-CM | POA: Diagnosis not present

## 2019-09-07 DIAGNOSIS — R635 Abnormal weight gain: Secondary | ICD-10-CM | POA: Diagnosis not present

## 2019-09-07 DIAGNOSIS — F413 Other mixed anxiety disorders: Secondary | ICD-10-CM | POA: Diagnosis not present

## 2019-09-07 DIAGNOSIS — F331 Major depressive disorder, recurrent, moderate: Secondary | ICD-10-CM | POA: Diagnosis not present

## 2019-09-11 ENCOUNTER — Other Ambulatory Visit: Payer: Self-pay | Admitting: Internal Medicine

## 2019-09-11 DIAGNOSIS — Z1231 Encounter for screening mammogram for malignant neoplasm of breast: Secondary | ICD-10-CM

## 2019-09-12 ENCOUNTER — Ambulatory Visit
Admission: RE | Admit: 2019-09-12 | Discharge: 2019-09-12 | Disposition: A | Discharge status: Home / Self Care | Payer: BC Managed Care – PPO | Source: Ambulatory Visit

## 2019-09-12 DIAGNOSIS — Z1231 Encounter for screening mammogram for malignant neoplasm of breast: Secondary | ICD-10-CM | POA: Diagnosis not present

## 2019-09-20 DIAGNOSIS — R635 Abnormal weight gain: Secondary | ICD-10-CM | POA: Diagnosis not present

## 2019-09-20 DIAGNOSIS — F3289 Other specified depressive episodes: Secondary | ICD-10-CM | POA: Diagnosis not present

## 2019-09-20 DIAGNOSIS — F331 Major depressive disorder, recurrent, moderate: Secondary | ICD-10-CM | POA: Diagnosis not present

## 2019-09-20 DIAGNOSIS — F413 Other mixed anxiety disorders: Secondary | ICD-10-CM | POA: Diagnosis not present

## 2019-09-24 DIAGNOSIS — J45909 Unspecified asthma, uncomplicated: Secondary | ICD-10-CM | POA: Diagnosis not present

## 2019-09-24 DIAGNOSIS — R5383 Other fatigue: Secondary | ICD-10-CM | POA: Diagnosis not present

## 2019-09-24 DIAGNOSIS — J3089 Other allergic rhinitis: Secondary | ICD-10-CM | POA: Diagnosis not present

## 2019-09-24 DIAGNOSIS — Z1152 Encounter for screening for COVID-19: Secondary | ICD-10-CM | POA: Diagnosis not present

## 2019-10-20 DIAGNOSIS — J4540 Moderate persistent asthma, uncomplicated: Secondary | ICD-10-CM | POA: Diagnosis not present

## 2019-10-20 DIAGNOSIS — Z0000 Encounter for general adult medical examination without abnormal findings: Secondary | ICD-10-CM | POA: Diagnosis not present

## 2019-10-20 DIAGNOSIS — Z13220 Encounter for screening for lipoid disorders: Secondary | ICD-10-CM | POA: Diagnosis not present

## 2019-10-20 DIAGNOSIS — Z91018 Allergy to other foods: Secondary | ICD-10-CM | POA: Diagnosis not present

## 2019-10-20 DIAGNOSIS — J302 Other seasonal allergic rhinitis: Secondary | ICD-10-CM | POA: Diagnosis not present

## 2019-10-30 DIAGNOSIS — F331 Major depressive disorder, recurrent, moderate: Secondary | ICD-10-CM | POA: Diagnosis not present

## 2019-10-30 DIAGNOSIS — F3289 Other specified depressive episodes: Secondary | ICD-10-CM | POA: Diagnosis not present

## 2019-10-30 DIAGNOSIS — F413 Other mixed anxiety disorders: Secondary | ICD-10-CM | POA: Diagnosis not present

## 2019-10-30 DIAGNOSIS — R635 Abnormal weight gain: Secondary | ICD-10-CM | POA: Diagnosis not present

## 2019-10-31 DIAGNOSIS — J301 Allergic rhinitis due to pollen: Secondary | ICD-10-CM | POA: Diagnosis not present

## 2019-10-31 DIAGNOSIS — T781XXD Other adverse food reactions, not elsewhere classified, subsequent encounter: Secondary | ICD-10-CM | POA: Diagnosis not present

## 2019-10-31 DIAGNOSIS — R062 Wheezing: Secondary | ICD-10-CM | POA: Diagnosis not present

## 2019-10-31 DIAGNOSIS — J3089 Other allergic rhinitis: Secondary | ICD-10-CM | POA: Diagnosis not present

## 2019-11-02 DIAGNOSIS — R635 Abnormal weight gain: Secondary | ICD-10-CM | POA: Diagnosis not present

## 2019-11-02 DIAGNOSIS — F3289 Other specified depressive episodes: Secondary | ICD-10-CM | POA: Diagnosis not present

## 2019-11-02 DIAGNOSIS — F413 Other mixed anxiety disorders: Secondary | ICD-10-CM | POA: Diagnosis not present

## 2019-11-02 DIAGNOSIS — F332 Major depressive disorder, recurrent severe without psychotic features: Secondary | ICD-10-CM | POA: Diagnosis not present

## 2019-11-13 DIAGNOSIS — F332 Major depressive disorder, recurrent severe without psychotic features: Secondary | ICD-10-CM | POA: Diagnosis not present

## 2019-11-13 DIAGNOSIS — R635 Abnormal weight gain: Secondary | ICD-10-CM | POA: Diagnosis not present

## 2019-11-13 DIAGNOSIS — F3289 Other specified depressive episodes: Secondary | ICD-10-CM | POA: Diagnosis not present

## 2019-11-13 DIAGNOSIS — F413 Other mixed anxiety disorders: Secondary | ICD-10-CM | POA: Diagnosis not present

## 2019-11-14 DIAGNOSIS — J301 Allergic rhinitis due to pollen: Secondary | ICD-10-CM | POA: Diagnosis not present

## 2019-11-15 DIAGNOSIS — J3089 Other allergic rhinitis: Secondary | ICD-10-CM | POA: Diagnosis not present

## 2019-11-27 DIAGNOSIS — F3289 Other specified depressive episodes: Secondary | ICD-10-CM | POA: Diagnosis not present

## 2019-11-27 DIAGNOSIS — F332 Major depressive disorder, recurrent severe without psychotic features: Secondary | ICD-10-CM | POA: Diagnosis not present

## 2019-11-27 DIAGNOSIS — F413 Other mixed anxiety disorders: Secondary | ICD-10-CM | POA: Diagnosis not present

## 2019-11-27 DIAGNOSIS — R635 Abnormal weight gain: Secondary | ICD-10-CM | POA: Diagnosis not present

## 2019-12-27 DIAGNOSIS — J0110 Acute frontal sinusitis, unspecified: Secondary | ICD-10-CM | POA: Diagnosis not present

## 2019-12-27 DIAGNOSIS — J3089 Other allergic rhinitis: Secondary | ICD-10-CM | POA: Diagnosis not present

## 2019-12-27 DIAGNOSIS — T7849XD Other allergy, subsequent encounter: Secondary | ICD-10-CM | POA: Diagnosis not present

## 2019-12-27 DIAGNOSIS — T781XXD Other adverse food reactions, not elsewhere classified, subsequent encounter: Secondary | ICD-10-CM | POA: Diagnosis not present

## 2019-12-27 DIAGNOSIS — R062 Wheezing: Secondary | ICD-10-CM | POA: Diagnosis not present

## 2019-12-27 DIAGNOSIS — J301 Allergic rhinitis due to pollen: Secondary | ICD-10-CM | POA: Diagnosis not present

## 2019-12-28 DIAGNOSIS — J3089 Other allergic rhinitis: Secondary | ICD-10-CM | POA: Diagnosis not present

## 2019-12-28 DIAGNOSIS — J301 Allergic rhinitis due to pollen: Secondary | ICD-10-CM | POA: Diagnosis not present

## 2020-01-03 DIAGNOSIS — J301 Allergic rhinitis due to pollen: Secondary | ICD-10-CM | POA: Diagnosis not present

## 2020-01-03 DIAGNOSIS — J3089 Other allergic rhinitis: Secondary | ICD-10-CM | POA: Diagnosis not present

## 2020-01-05 DIAGNOSIS — J3089 Other allergic rhinitis: Secondary | ICD-10-CM | POA: Diagnosis not present

## 2020-01-05 DIAGNOSIS — J301 Allergic rhinitis due to pollen: Secondary | ICD-10-CM | POA: Diagnosis not present

## 2020-01-10 DIAGNOSIS — J301 Allergic rhinitis due to pollen: Secondary | ICD-10-CM | POA: Diagnosis not present

## 2020-01-10 DIAGNOSIS — J3089 Other allergic rhinitis: Secondary | ICD-10-CM | POA: Diagnosis not present

## 2020-01-12 DIAGNOSIS — J3089 Other allergic rhinitis: Secondary | ICD-10-CM | POA: Diagnosis not present

## 2020-01-12 DIAGNOSIS — J301 Allergic rhinitis due to pollen: Secondary | ICD-10-CM | POA: Diagnosis not present

## 2020-01-17 DIAGNOSIS — J3089 Other allergic rhinitis: Secondary | ICD-10-CM | POA: Diagnosis not present

## 2020-01-17 DIAGNOSIS — J301 Allergic rhinitis due to pollen: Secondary | ICD-10-CM | POA: Diagnosis not present

## 2020-01-19 DIAGNOSIS — J301 Allergic rhinitis due to pollen: Secondary | ICD-10-CM | POA: Diagnosis not present

## 2020-01-19 DIAGNOSIS — J3089 Other allergic rhinitis: Secondary | ICD-10-CM | POA: Diagnosis not present

## 2020-01-23 DIAGNOSIS — J301 Allergic rhinitis due to pollen: Secondary | ICD-10-CM | POA: Diagnosis not present

## 2020-01-23 DIAGNOSIS — J3089 Other allergic rhinitis: Secondary | ICD-10-CM | POA: Diagnosis not present

## 2020-01-25 DIAGNOSIS — J301 Allergic rhinitis due to pollen: Secondary | ICD-10-CM | POA: Diagnosis not present

## 2020-01-25 DIAGNOSIS — J3089 Other allergic rhinitis: Secondary | ICD-10-CM | POA: Diagnosis not present

## 2020-01-31 DIAGNOSIS — J3089 Other allergic rhinitis: Secondary | ICD-10-CM | POA: Diagnosis not present

## 2020-01-31 DIAGNOSIS — J301 Allergic rhinitis due to pollen: Secondary | ICD-10-CM | POA: Diagnosis not present

## 2020-02-02 DIAGNOSIS — J301 Allergic rhinitis due to pollen: Secondary | ICD-10-CM | POA: Diagnosis not present

## 2020-02-02 DIAGNOSIS — J3089 Other allergic rhinitis: Secondary | ICD-10-CM | POA: Diagnosis not present

## 2020-02-07 DIAGNOSIS — J301 Allergic rhinitis due to pollen: Secondary | ICD-10-CM | POA: Diagnosis not present

## 2020-02-07 DIAGNOSIS — J3089 Other allergic rhinitis: Secondary | ICD-10-CM | POA: Diagnosis not present

## 2020-02-09 DIAGNOSIS — J3089 Other allergic rhinitis: Secondary | ICD-10-CM | POA: Diagnosis not present

## 2020-02-09 DIAGNOSIS — J301 Allergic rhinitis due to pollen: Secondary | ICD-10-CM | POA: Diagnosis not present

## 2020-02-12 DIAGNOSIS — J3089 Other allergic rhinitis: Secondary | ICD-10-CM | POA: Diagnosis not present

## 2020-02-12 DIAGNOSIS — J301 Allergic rhinitis due to pollen: Secondary | ICD-10-CM | POA: Diagnosis not present

## 2020-02-14 DIAGNOSIS — J301 Allergic rhinitis due to pollen: Secondary | ICD-10-CM | POA: Diagnosis not present

## 2020-02-14 DIAGNOSIS — J3089 Other allergic rhinitis: Secondary | ICD-10-CM | POA: Diagnosis not present

## 2020-02-15 DIAGNOSIS — F413 Other mixed anxiety disorders: Secondary | ICD-10-CM | POA: Diagnosis not present

## 2020-02-15 DIAGNOSIS — R635 Abnormal weight gain: Secondary | ICD-10-CM | POA: Diagnosis not present

## 2020-02-15 DIAGNOSIS — F3289 Other specified depressive episodes: Secondary | ICD-10-CM | POA: Diagnosis not present

## 2020-02-15 DIAGNOSIS — F332 Major depressive disorder, recurrent severe without psychotic features: Secondary | ICD-10-CM | POA: Diagnosis not present

## 2020-02-16 DIAGNOSIS — J301 Allergic rhinitis due to pollen: Secondary | ICD-10-CM | POA: Diagnosis not present

## 2020-02-16 DIAGNOSIS — J3089 Other allergic rhinitis: Secondary | ICD-10-CM | POA: Diagnosis not present

## 2020-02-20 DIAGNOSIS — F3289 Other specified depressive episodes: Secondary | ICD-10-CM | POA: Diagnosis not present

## 2020-02-20 DIAGNOSIS — F332 Major depressive disorder, recurrent severe without psychotic features: Secondary | ICD-10-CM | POA: Diagnosis not present

## 2020-02-20 DIAGNOSIS — F413 Other mixed anxiety disorders: Secondary | ICD-10-CM | POA: Diagnosis not present

## 2020-02-20 DIAGNOSIS — R635 Abnormal weight gain: Secondary | ICD-10-CM | POA: Diagnosis not present

## 2020-02-20 DIAGNOSIS — J3089 Other allergic rhinitis: Secondary | ICD-10-CM | POA: Diagnosis not present

## 2020-02-20 DIAGNOSIS — J301 Allergic rhinitis due to pollen: Secondary | ICD-10-CM | POA: Diagnosis not present

## 2020-02-23 DIAGNOSIS — J301 Allergic rhinitis due to pollen: Secondary | ICD-10-CM | POA: Diagnosis not present

## 2020-02-23 DIAGNOSIS — J3089 Other allergic rhinitis: Secondary | ICD-10-CM | POA: Diagnosis not present

## 2020-02-28 DIAGNOSIS — J3089 Other allergic rhinitis: Secondary | ICD-10-CM | POA: Diagnosis not present

## 2020-02-28 DIAGNOSIS — J301 Allergic rhinitis due to pollen: Secondary | ICD-10-CM | POA: Diagnosis not present

## 2020-03-01 DIAGNOSIS — J301 Allergic rhinitis due to pollen: Secondary | ICD-10-CM | POA: Diagnosis not present

## 2020-03-01 DIAGNOSIS — J3089 Other allergic rhinitis: Secondary | ICD-10-CM | POA: Diagnosis not present

## 2020-03-07 DIAGNOSIS — R062 Wheezing: Secondary | ICD-10-CM | POA: Diagnosis not present

## 2020-03-07 DIAGNOSIS — J3089 Other allergic rhinitis: Secondary | ICD-10-CM | POA: Diagnosis not present

## 2020-03-07 DIAGNOSIS — L299 Pruritus, unspecified: Secondary | ICD-10-CM | POA: Diagnosis not present

## 2020-03-07 DIAGNOSIS — J301 Allergic rhinitis due to pollen: Secondary | ICD-10-CM | POA: Diagnosis not present

## 2020-03-07 DIAGNOSIS — T781XXA Other adverse food reactions, not elsewhere classified, initial encounter: Secondary | ICD-10-CM | POA: Diagnosis not present

## 2020-03-14 DIAGNOSIS — F3289 Other specified depressive episodes: Secondary | ICD-10-CM | POA: Diagnosis not present

## 2020-03-14 DIAGNOSIS — R635 Abnormal weight gain: Secondary | ICD-10-CM | POA: Diagnosis not present

## 2020-03-14 DIAGNOSIS — F413 Other mixed anxiety disorders: Secondary | ICD-10-CM | POA: Diagnosis not present

## 2020-03-14 DIAGNOSIS — F332 Major depressive disorder, recurrent severe without psychotic features: Secondary | ICD-10-CM | POA: Diagnosis not present

## 2020-03-20 DIAGNOSIS — J3089 Other allergic rhinitis: Secondary | ICD-10-CM | POA: Diagnosis not present

## 2020-03-20 DIAGNOSIS — J301 Allergic rhinitis due to pollen: Secondary | ICD-10-CM | POA: Diagnosis not present

## 2020-03-21 DIAGNOSIS — F3289 Other specified depressive episodes: Secondary | ICD-10-CM | POA: Diagnosis not present

## 2020-03-21 DIAGNOSIS — R635 Abnormal weight gain: Secondary | ICD-10-CM | POA: Diagnosis not present

## 2020-03-21 DIAGNOSIS — F332 Major depressive disorder, recurrent severe without psychotic features: Secondary | ICD-10-CM | POA: Diagnosis not present

## 2020-03-21 DIAGNOSIS — F413 Other mixed anxiety disorders: Secondary | ICD-10-CM | POA: Diagnosis not present

## 2020-03-22 DIAGNOSIS — J3089 Other allergic rhinitis: Secondary | ICD-10-CM | POA: Diagnosis not present

## 2020-03-22 DIAGNOSIS — J301 Allergic rhinitis due to pollen: Secondary | ICD-10-CM | POA: Diagnosis not present

## 2020-03-27 DIAGNOSIS — F413 Other mixed anxiety disorders: Secondary | ICD-10-CM | POA: Diagnosis not present

## 2020-03-27 DIAGNOSIS — J3081 Allergic rhinitis due to animal (cat) (dog) hair and dander: Secondary | ICD-10-CM | POA: Diagnosis not present

## 2020-03-27 DIAGNOSIS — R635 Abnormal weight gain: Secondary | ICD-10-CM | POA: Diagnosis not present

## 2020-03-27 DIAGNOSIS — J301 Allergic rhinitis due to pollen: Secondary | ICD-10-CM | POA: Diagnosis not present

## 2020-03-27 DIAGNOSIS — F3289 Other specified depressive episodes: Secondary | ICD-10-CM | POA: Diagnosis not present

## 2020-03-27 DIAGNOSIS — J3089 Other allergic rhinitis: Secondary | ICD-10-CM | POA: Diagnosis not present

## 2020-03-27 DIAGNOSIS — F332 Major depressive disorder, recurrent severe without psychotic features: Secondary | ICD-10-CM | POA: Diagnosis not present

## 2020-03-29 DIAGNOSIS — J301 Allergic rhinitis due to pollen: Secondary | ICD-10-CM | POA: Diagnosis not present

## 2020-03-29 DIAGNOSIS — J3089 Other allergic rhinitis: Secondary | ICD-10-CM | POA: Diagnosis not present

## 2020-04-03 DIAGNOSIS — J301 Allergic rhinitis due to pollen: Secondary | ICD-10-CM | POA: Diagnosis not present

## 2020-04-03 DIAGNOSIS — J3089 Other allergic rhinitis: Secondary | ICD-10-CM | POA: Diagnosis not present

## 2020-04-08 ENCOUNTER — Other Ambulatory Visit: Payer: BC Managed Care – PPO

## 2020-04-08 DIAGNOSIS — J301 Allergic rhinitis due to pollen: Secondary | ICD-10-CM | POA: Diagnosis not present

## 2020-04-08 DIAGNOSIS — Z20822 Contact with and (suspected) exposure to covid-19: Secondary | ICD-10-CM

## 2020-04-08 DIAGNOSIS — J3089 Other allergic rhinitis: Secondary | ICD-10-CM | POA: Diagnosis not present

## 2020-04-09 LAB — SARS-COV-2, NAA 2 DAY TAT

## 2020-04-09 LAB — NOVEL CORONAVIRUS, NAA: SARS-CoV-2, NAA: NOT DETECTED

## 2020-04-11 DIAGNOSIS — J3089 Other allergic rhinitis: Secondary | ICD-10-CM | POA: Diagnosis not present

## 2020-04-11 DIAGNOSIS — J301 Allergic rhinitis due to pollen: Secondary | ICD-10-CM | POA: Diagnosis not present

## 2020-04-17 DIAGNOSIS — F413 Other mixed anxiety disorders: Secondary | ICD-10-CM | POA: Diagnosis not present

## 2020-04-17 DIAGNOSIS — F332 Major depressive disorder, recurrent severe without psychotic features: Secondary | ICD-10-CM | POA: Diagnosis not present

## 2020-04-17 DIAGNOSIS — F3289 Other specified depressive episodes: Secondary | ICD-10-CM | POA: Diagnosis not present

## 2020-04-17 DIAGNOSIS — J3089 Other allergic rhinitis: Secondary | ICD-10-CM | POA: Diagnosis not present

## 2020-04-17 DIAGNOSIS — J301 Allergic rhinitis due to pollen: Secondary | ICD-10-CM | POA: Diagnosis not present

## 2020-04-17 DIAGNOSIS — R635 Abnormal weight gain: Secondary | ICD-10-CM | POA: Diagnosis not present

## 2020-04-19 DIAGNOSIS — J3089 Other allergic rhinitis: Secondary | ICD-10-CM | POA: Diagnosis not present

## 2020-04-19 DIAGNOSIS — J301 Allergic rhinitis due to pollen: Secondary | ICD-10-CM | POA: Diagnosis not present

## 2020-04-23 DIAGNOSIS — Z23 Encounter for immunization: Secondary | ICD-10-CM | POA: Diagnosis not present

## 2020-04-25 DIAGNOSIS — F413 Other mixed anxiety disorders: Secondary | ICD-10-CM | POA: Diagnosis not present

## 2020-04-25 DIAGNOSIS — F3289 Other specified depressive episodes: Secondary | ICD-10-CM | POA: Diagnosis not present

## 2020-04-25 DIAGNOSIS — R635 Abnormal weight gain: Secondary | ICD-10-CM | POA: Diagnosis not present

## 2020-04-25 DIAGNOSIS — F332 Major depressive disorder, recurrent severe without psychotic features: Secondary | ICD-10-CM | POA: Diagnosis not present

## 2020-04-30 DIAGNOSIS — J3089 Other allergic rhinitis: Secondary | ICD-10-CM | POA: Diagnosis not present

## 2020-04-30 DIAGNOSIS — J301 Allergic rhinitis due to pollen: Secondary | ICD-10-CM | POA: Diagnosis not present

## 2020-05-02 DIAGNOSIS — J3089 Other allergic rhinitis: Secondary | ICD-10-CM | POA: Diagnosis not present

## 2020-05-02 DIAGNOSIS — J301 Allergic rhinitis due to pollen: Secondary | ICD-10-CM | POA: Diagnosis not present

## 2020-05-08 DIAGNOSIS — J301 Allergic rhinitis due to pollen: Secondary | ICD-10-CM | POA: Diagnosis not present

## 2020-05-08 DIAGNOSIS — J3089 Other allergic rhinitis: Secondary | ICD-10-CM | POA: Diagnosis not present

## 2020-05-14 DIAGNOSIS — J3089 Other allergic rhinitis: Secondary | ICD-10-CM | POA: Diagnosis not present

## 2020-05-14 DIAGNOSIS — J301 Allergic rhinitis due to pollen: Secondary | ICD-10-CM | POA: Diagnosis not present

## 2020-05-17 DIAGNOSIS — J301 Allergic rhinitis due to pollen: Secondary | ICD-10-CM | POA: Diagnosis not present

## 2020-05-17 DIAGNOSIS — J3089 Other allergic rhinitis: Secondary | ICD-10-CM | POA: Diagnosis not present

## 2020-05-22 DIAGNOSIS — J3089 Other allergic rhinitis: Secondary | ICD-10-CM | POA: Diagnosis not present

## 2020-05-22 DIAGNOSIS — J301 Allergic rhinitis due to pollen: Secondary | ICD-10-CM | POA: Diagnosis not present

## 2020-05-24 DIAGNOSIS — J3089 Other allergic rhinitis: Secondary | ICD-10-CM | POA: Diagnosis not present

## 2020-05-24 DIAGNOSIS — J301 Allergic rhinitis due to pollen: Secondary | ICD-10-CM | POA: Diagnosis not present

## 2020-06-12 DIAGNOSIS — J301 Allergic rhinitis due to pollen: Secondary | ICD-10-CM | POA: Diagnosis not present

## 2020-06-12 DIAGNOSIS — J3089 Other allergic rhinitis: Secondary | ICD-10-CM | POA: Diagnosis not present

## 2020-06-12 DIAGNOSIS — J069 Acute upper respiratory infection, unspecified: Secondary | ICD-10-CM | POA: Diagnosis not present

## 2020-06-12 DIAGNOSIS — T781XXA Other adverse food reactions, not elsewhere classified, initial encounter: Secondary | ICD-10-CM | POA: Diagnosis not present

## 2020-06-14 DIAGNOSIS — J3089 Other allergic rhinitis: Secondary | ICD-10-CM | POA: Diagnosis not present

## 2020-06-14 DIAGNOSIS — J301 Allergic rhinitis due to pollen: Secondary | ICD-10-CM | POA: Diagnosis not present

## 2020-06-14 DIAGNOSIS — J3081 Allergic rhinitis due to animal (cat) (dog) hair and dander: Secondary | ICD-10-CM | POA: Diagnosis not present

## 2020-06-19 DIAGNOSIS — J301 Allergic rhinitis due to pollen: Secondary | ICD-10-CM | POA: Diagnosis not present

## 2020-06-19 DIAGNOSIS — J3089 Other allergic rhinitis: Secondary | ICD-10-CM | POA: Diagnosis not present

## 2020-06-20 DIAGNOSIS — J069 Acute upper respiratory infection, unspecified: Secondary | ICD-10-CM | POA: Diagnosis not present

## 2020-06-21 DIAGNOSIS — J3089 Other allergic rhinitis: Secondary | ICD-10-CM | POA: Diagnosis not present

## 2020-06-21 DIAGNOSIS — J3081 Allergic rhinitis due to animal (cat) (dog) hair and dander: Secondary | ICD-10-CM | POA: Diagnosis not present

## 2020-06-21 DIAGNOSIS — J301 Allergic rhinitis due to pollen: Secondary | ICD-10-CM | POA: Diagnosis not present

## 2020-06-26 DIAGNOSIS — J3089 Other allergic rhinitis: Secondary | ICD-10-CM | POA: Diagnosis not present

## 2020-06-26 DIAGNOSIS — J301 Allergic rhinitis due to pollen: Secondary | ICD-10-CM | POA: Diagnosis not present

## 2020-06-28 DIAGNOSIS — J301 Allergic rhinitis due to pollen: Secondary | ICD-10-CM | POA: Diagnosis not present

## 2020-06-28 DIAGNOSIS — J3089 Other allergic rhinitis: Secondary | ICD-10-CM | POA: Diagnosis not present

## 2020-06-28 DIAGNOSIS — J3081 Allergic rhinitis due to animal (cat) (dog) hair and dander: Secondary | ICD-10-CM | POA: Diagnosis not present

## 2020-07-02 DIAGNOSIS — J301 Allergic rhinitis due to pollen: Secondary | ICD-10-CM | POA: Diagnosis not present

## 2020-07-02 DIAGNOSIS — J3089 Other allergic rhinitis: Secondary | ICD-10-CM | POA: Diagnosis not present

## 2020-07-09 DIAGNOSIS — J301 Allergic rhinitis due to pollen: Secondary | ICD-10-CM | POA: Diagnosis not present

## 2020-07-09 DIAGNOSIS — J3089 Other allergic rhinitis: Secondary | ICD-10-CM | POA: Diagnosis not present

## 2020-07-18 DIAGNOSIS — Z01419 Encounter for gynecological examination (general) (routine) without abnormal findings: Secondary | ICD-10-CM | POA: Diagnosis not present

## 2020-07-18 DIAGNOSIS — Z6838 Body mass index (BMI) 38.0-38.9, adult: Secondary | ICD-10-CM | POA: Diagnosis not present

## 2020-07-18 DIAGNOSIS — J3081 Allergic rhinitis due to animal (cat) (dog) hair and dander: Secondary | ICD-10-CM | POA: Diagnosis not present

## 2020-07-18 DIAGNOSIS — Z1231 Encounter for screening mammogram for malignant neoplasm of breast: Secondary | ICD-10-CM | POA: Diagnosis not present

## 2020-07-18 DIAGNOSIS — J3089 Other allergic rhinitis: Secondary | ICD-10-CM | POA: Diagnosis not present

## 2020-07-18 DIAGNOSIS — J301 Allergic rhinitis due to pollen: Secondary | ICD-10-CM | POA: Diagnosis not present

## 2020-07-25 DIAGNOSIS — J3089 Other allergic rhinitis: Secondary | ICD-10-CM | POA: Diagnosis not present

## 2020-07-25 DIAGNOSIS — J301 Allergic rhinitis due to pollen: Secondary | ICD-10-CM | POA: Diagnosis not present

## 2020-07-31 DIAGNOSIS — J301 Allergic rhinitis due to pollen: Secondary | ICD-10-CM | POA: Diagnosis not present

## 2020-07-31 DIAGNOSIS — J3089 Other allergic rhinitis: Secondary | ICD-10-CM | POA: Diagnosis not present

## 2020-08-02 DIAGNOSIS — J301 Allergic rhinitis due to pollen: Secondary | ICD-10-CM | POA: Diagnosis not present

## 2020-08-05 DIAGNOSIS — J3089 Other allergic rhinitis: Secondary | ICD-10-CM | POA: Diagnosis not present

## 2020-08-08 DIAGNOSIS — J3081 Allergic rhinitis due to animal (cat) (dog) hair and dander: Secondary | ICD-10-CM | POA: Diagnosis not present

## 2020-08-08 DIAGNOSIS — J301 Allergic rhinitis due to pollen: Secondary | ICD-10-CM | POA: Diagnosis not present

## 2020-08-08 DIAGNOSIS — J3089 Other allergic rhinitis: Secondary | ICD-10-CM | POA: Diagnosis not present

## 2020-08-15 DIAGNOSIS — J301 Allergic rhinitis due to pollen: Secondary | ICD-10-CM | POA: Diagnosis not present

## 2020-08-15 DIAGNOSIS — J3089 Other allergic rhinitis: Secondary | ICD-10-CM | POA: Diagnosis not present

## 2020-08-20 DIAGNOSIS — J301 Allergic rhinitis due to pollen: Secondary | ICD-10-CM | POA: Diagnosis not present

## 2020-08-20 DIAGNOSIS — J3089 Other allergic rhinitis: Secondary | ICD-10-CM | POA: Diagnosis not present

## 2020-08-23 DIAGNOSIS — J3089 Other allergic rhinitis: Secondary | ICD-10-CM | POA: Diagnosis not present

## 2020-08-23 DIAGNOSIS — J301 Allergic rhinitis due to pollen: Secondary | ICD-10-CM | POA: Diagnosis not present

## 2020-08-27 DIAGNOSIS — J3089 Other allergic rhinitis: Secondary | ICD-10-CM | POA: Diagnosis not present

## 2020-08-27 DIAGNOSIS — J301 Allergic rhinitis due to pollen: Secondary | ICD-10-CM | POA: Diagnosis not present

## 2020-09-04 DIAGNOSIS — J301 Allergic rhinitis due to pollen: Secondary | ICD-10-CM | POA: Diagnosis not present

## 2020-09-04 DIAGNOSIS — J3089 Other allergic rhinitis: Secondary | ICD-10-CM | POA: Diagnosis not present

## 2020-09-12 DIAGNOSIS — J3089 Other allergic rhinitis: Secondary | ICD-10-CM | POA: Diagnosis not present

## 2020-09-12 DIAGNOSIS — J301 Allergic rhinitis due to pollen: Secondary | ICD-10-CM | POA: Diagnosis not present

## 2020-09-19 DIAGNOSIS — J301 Allergic rhinitis due to pollen: Secondary | ICD-10-CM | POA: Diagnosis not present

## 2020-09-19 DIAGNOSIS — J3089 Other allergic rhinitis: Secondary | ICD-10-CM | POA: Diagnosis not present

## 2020-09-19 DIAGNOSIS — J3081 Allergic rhinitis due to animal (cat) (dog) hair and dander: Secondary | ICD-10-CM | POA: Diagnosis not present

## 2020-09-26 DIAGNOSIS — J301 Allergic rhinitis due to pollen: Secondary | ICD-10-CM | POA: Diagnosis not present

## 2020-09-26 DIAGNOSIS — J3089 Other allergic rhinitis: Secondary | ICD-10-CM | POA: Diagnosis not present

## 2020-10-01 DIAGNOSIS — R222 Localized swelling, mass and lump, trunk: Secondary | ICD-10-CM | POA: Diagnosis not present

## 2020-10-03 DIAGNOSIS — F331 Major depressive disorder, recurrent, moderate: Secondary | ICD-10-CM | POA: Diagnosis not present

## 2020-10-03 DIAGNOSIS — J301 Allergic rhinitis due to pollen: Secondary | ICD-10-CM | POA: Diagnosis not present

## 2020-10-03 DIAGNOSIS — J3089 Other allergic rhinitis: Secondary | ICD-10-CM | POA: Diagnosis not present

## 2020-10-03 DIAGNOSIS — F4320 Adjustment disorder, unspecified: Secondary | ICD-10-CM | POA: Diagnosis not present

## 2020-10-03 DIAGNOSIS — F410 Panic disorder [episodic paroxysmal anxiety] without agoraphobia: Secondary | ICD-10-CM | POA: Diagnosis not present

## 2020-10-07 ENCOUNTER — Other Ambulatory Visit: Payer: Self-pay | Admitting: Family Medicine

## 2020-10-07 DIAGNOSIS — R222 Localized swelling, mass and lump, trunk: Secondary | ICD-10-CM

## 2020-10-08 ENCOUNTER — Ambulatory Visit
Admission: RE | Admit: 2020-10-08 | Discharge: 2020-10-08 | Disposition: A | Discharge status: Home / Self Care | Payer: BC Managed Care – PPO | Source: Ambulatory Visit | Attending: Family Medicine | Admitting: Family Medicine

## 2020-10-08 DIAGNOSIS — R222 Localized swelling, mass and lump, trunk: Secondary | ICD-10-CM | POA: Diagnosis not present

## 2020-10-09 DIAGNOSIS — J301 Allergic rhinitis due to pollen: Secondary | ICD-10-CM | POA: Diagnosis not present

## 2020-10-09 DIAGNOSIS — J3089 Other allergic rhinitis: Secondary | ICD-10-CM | POA: Diagnosis not present

## 2020-10-10 DIAGNOSIS — F3289 Other specified depressive episodes: Secondary | ICD-10-CM | POA: Diagnosis not present

## 2020-10-10 DIAGNOSIS — F332 Major depressive disorder, recurrent severe without psychotic features: Secondary | ICD-10-CM | POA: Diagnosis not present

## 2020-10-10 DIAGNOSIS — F413 Other mixed anxiety disorders: Secondary | ICD-10-CM | POA: Diagnosis not present

## 2020-10-10 DIAGNOSIS — R635 Abnormal weight gain: Secondary | ICD-10-CM | POA: Diagnosis not present

## 2020-11-07 DIAGNOSIS — J301 Allergic rhinitis due to pollen: Secondary | ICD-10-CM | POA: Diagnosis not present

## 2020-11-07 DIAGNOSIS — J3089 Other allergic rhinitis: Secondary | ICD-10-CM | POA: Diagnosis not present

## 2020-11-08 DIAGNOSIS — D171 Benign lipomatous neoplasm of skin and subcutaneous tissue of trunk: Secondary | ICD-10-CM | POA: Diagnosis not present

## 2020-11-14 DIAGNOSIS — J3089 Other allergic rhinitis: Secondary | ICD-10-CM | POA: Diagnosis not present

## 2020-11-14 DIAGNOSIS — J301 Allergic rhinitis due to pollen: Secondary | ICD-10-CM | POA: Diagnosis not present

## 2020-11-21 DIAGNOSIS — J3089 Other allergic rhinitis: Secondary | ICD-10-CM | POA: Diagnosis not present

## 2020-11-21 DIAGNOSIS — J301 Allergic rhinitis due to pollen: Secondary | ICD-10-CM | POA: Diagnosis not present

## 2020-11-28 DIAGNOSIS — J3089 Other allergic rhinitis: Secondary | ICD-10-CM | POA: Diagnosis not present

## 2020-11-28 DIAGNOSIS — J301 Allergic rhinitis due to pollen: Secondary | ICD-10-CM | POA: Diagnosis not present

## 2020-12-05 DIAGNOSIS — J301 Allergic rhinitis due to pollen: Secondary | ICD-10-CM | POA: Diagnosis not present

## 2020-12-05 DIAGNOSIS — J3089 Other allergic rhinitis: Secondary | ICD-10-CM | POA: Diagnosis not present

## 2020-12-12 DIAGNOSIS — J301 Allergic rhinitis due to pollen: Secondary | ICD-10-CM | POA: Diagnosis not present

## 2020-12-12 DIAGNOSIS — J3089 Other allergic rhinitis: Secondary | ICD-10-CM | POA: Diagnosis not present

## 2020-12-19 DIAGNOSIS — J3089 Other allergic rhinitis: Secondary | ICD-10-CM | POA: Diagnosis not present

## 2020-12-19 DIAGNOSIS — J301 Allergic rhinitis due to pollen: Secondary | ICD-10-CM | POA: Diagnosis not present

## 2020-12-26 DIAGNOSIS — J3089 Other allergic rhinitis: Secondary | ICD-10-CM | POA: Diagnosis not present

## 2020-12-26 DIAGNOSIS — J301 Allergic rhinitis due to pollen: Secondary | ICD-10-CM | POA: Diagnosis not present

## 2021-01-02 DIAGNOSIS — J3089 Other allergic rhinitis: Secondary | ICD-10-CM | POA: Diagnosis not present

## 2021-01-02 DIAGNOSIS — J301 Allergic rhinitis due to pollen: Secondary | ICD-10-CM | POA: Diagnosis not present

## 2021-01-09 DIAGNOSIS — J3089 Other allergic rhinitis: Secondary | ICD-10-CM | POA: Diagnosis not present

## 2021-01-09 DIAGNOSIS — J301 Allergic rhinitis due to pollen: Secondary | ICD-10-CM | POA: Diagnosis not present

## 2021-01-16 DIAGNOSIS — J301 Allergic rhinitis due to pollen: Secondary | ICD-10-CM | POA: Diagnosis not present

## 2021-01-16 DIAGNOSIS — J3089 Other allergic rhinitis: Secondary | ICD-10-CM | POA: Diagnosis not present

## 2021-01-23 DIAGNOSIS — J3089 Other allergic rhinitis: Secondary | ICD-10-CM | POA: Diagnosis not present

## 2021-01-23 DIAGNOSIS — J301 Allergic rhinitis due to pollen: Secondary | ICD-10-CM | POA: Diagnosis not present

## 2021-01-30 DIAGNOSIS — D171 Benign lipomatous neoplasm of skin and subcutaneous tissue of trunk: Secondary | ICD-10-CM | POA: Diagnosis not present

## 2021-02-06 DIAGNOSIS — J301 Allergic rhinitis due to pollen: Secondary | ICD-10-CM | POA: Diagnosis not present

## 2021-02-06 DIAGNOSIS — J3089 Other allergic rhinitis: Secondary | ICD-10-CM | POA: Diagnosis not present

## 2021-02-13 DIAGNOSIS — J301 Allergic rhinitis due to pollen: Secondary | ICD-10-CM | POA: Diagnosis not present

## 2021-02-13 DIAGNOSIS — J3089 Other allergic rhinitis: Secondary | ICD-10-CM | POA: Diagnosis not present

## 2021-02-20 DIAGNOSIS — J301 Allergic rhinitis due to pollen: Secondary | ICD-10-CM | POA: Diagnosis not present

## 2021-02-20 DIAGNOSIS — J3089 Other allergic rhinitis: Secondary | ICD-10-CM | POA: Diagnosis not present

## 2021-02-27 DIAGNOSIS — J3089 Other allergic rhinitis: Secondary | ICD-10-CM | POA: Diagnosis not present

## 2021-02-27 DIAGNOSIS — J301 Allergic rhinitis due to pollen: Secondary | ICD-10-CM | POA: Diagnosis not present

## 2021-03-05 DIAGNOSIS — T781XXD Other adverse food reactions, not elsewhere classified, subsequent encounter: Secondary | ICD-10-CM | POA: Diagnosis not present

## 2021-03-05 DIAGNOSIS — J301 Allergic rhinitis due to pollen: Secondary | ICD-10-CM | POA: Diagnosis not present

## 2021-03-05 DIAGNOSIS — J3089 Other allergic rhinitis: Secondary | ICD-10-CM | POA: Diagnosis not present

## 2021-03-05 DIAGNOSIS — R062 Wheezing: Secondary | ICD-10-CM | POA: Diagnosis not present

## 2021-03-14 DIAGNOSIS — J301 Allergic rhinitis due to pollen: Secondary | ICD-10-CM | POA: Diagnosis not present

## 2021-03-14 DIAGNOSIS — J3089 Other allergic rhinitis: Secondary | ICD-10-CM | POA: Diagnosis not present

## 2021-03-20 DIAGNOSIS — J301 Allergic rhinitis due to pollen: Secondary | ICD-10-CM | POA: Diagnosis not present

## 2021-03-20 DIAGNOSIS — J3089 Other allergic rhinitis: Secondary | ICD-10-CM | POA: Diagnosis not present

## 2021-03-27 DIAGNOSIS — J3089 Other allergic rhinitis: Secondary | ICD-10-CM | POA: Diagnosis not present

## 2021-03-27 DIAGNOSIS — J301 Allergic rhinitis due to pollen: Secondary | ICD-10-CM | POA: Diagnosis not present

## 2021-03-31 DIAGNOSIS — J301 Allergic rhinitis due to pollen: Secondary | ICD-10-CM | POA: Diagnosis not present

## 2021-04-01 DIAGNOSIS — J3089 Other allergic rhinitis: Secondary | ICD-10-CM | POA: Diagnosis not present

## 2021-04-04 DIAGNOSIS — J3089 Other allergic rhinitis: Secondary | ICD-10-CM | POA: Diagnosis not present

## 2021-04-04 DIAGNOSIS — J301 Allergic rhinitis due to pollen: Secondary | ICD-10-CM | POA: Diagnosis not present

## 2021-04-09 DIAGNOSIS — J3089 Other allergic rhinitis: Secondary | ICD-10-CM | POA: Diagnosis not present

## 2021-04-09 DIAGNOSIS — J301 Allergic rhinitis due to pollen: Secondary | ICD-10-CM | POA: Diagnosis not present

## 2021-04-12 DIAGNOSIS — L299 Pruritus, unspecified: Secondary | ICD-10-CM | POA: Diagnosis not present

## 2021-04-12 DIAGNOSIS — J208 Acute bronchitis due to other specified organisms: Secondary | ICD-10-CM | POA: Diagnosis not present

## 2021-04-16 DIAGNOSIS — J3089 Other allergic rhinitis: Secondary | ICD-10-CM | POA: Diagnosis not present

## 2021-04-16 DIAGNOSIS — J301 Allergic rhinitis due to pollen: Secondary | ICD-10-CM | POA: Diagnosis not present

## 2021-04-22 IMAGING — US US ABDOMEN LIMITED RUQ/ASCITES
1 series · 11 of 11 positions shown · non-contrast
Comparison: None.

CLINICAL DATA: Subcutaneous mass lower left mid back

EXAM:
ULTRASOUND ABDOMEN LIMITED

[Series 1: us abdomen limited ruq/ascites · 0.11mm/px · 11 of 11 slices shown]
[im 1/11]
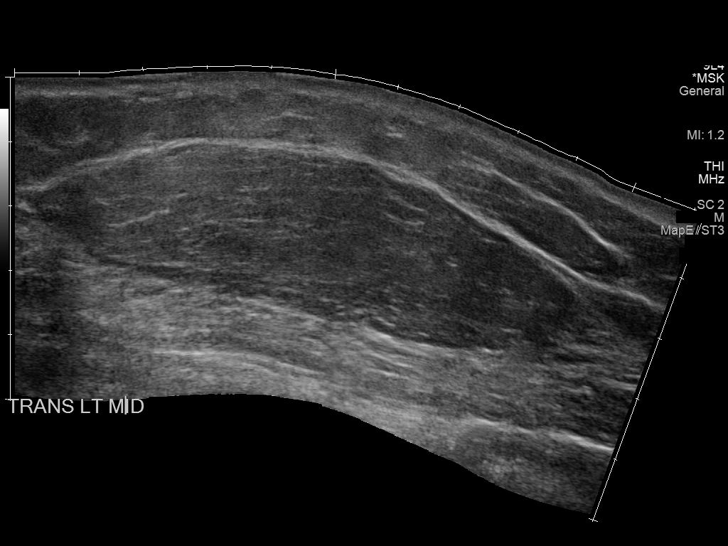
[im 2/11]
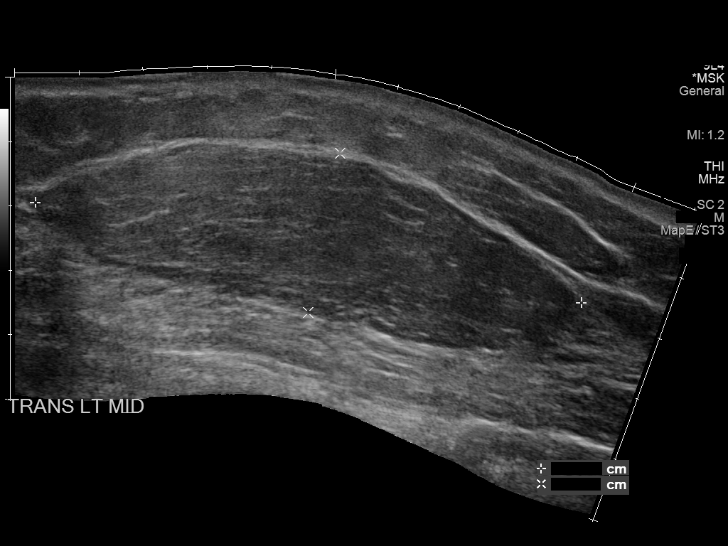
[im 3/11]
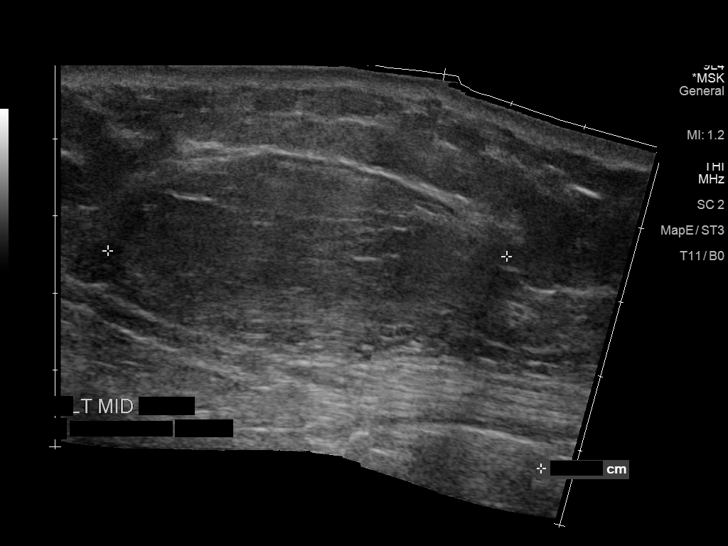
[im 4/11]
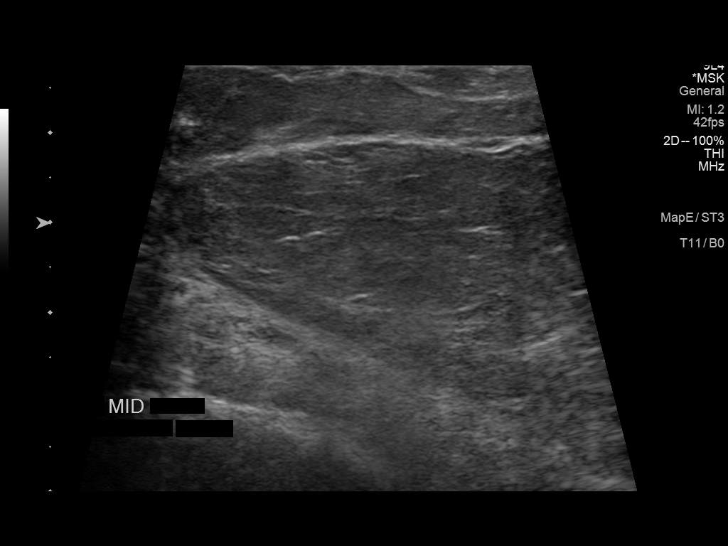
[im 5/11]
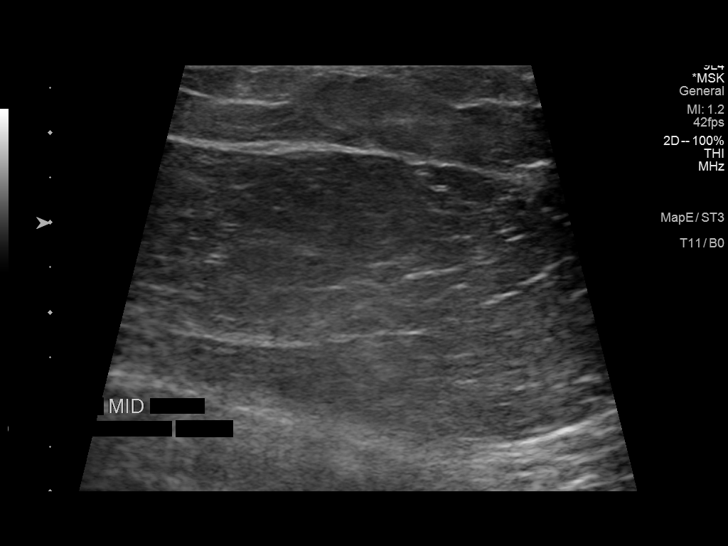
[im 6/11]
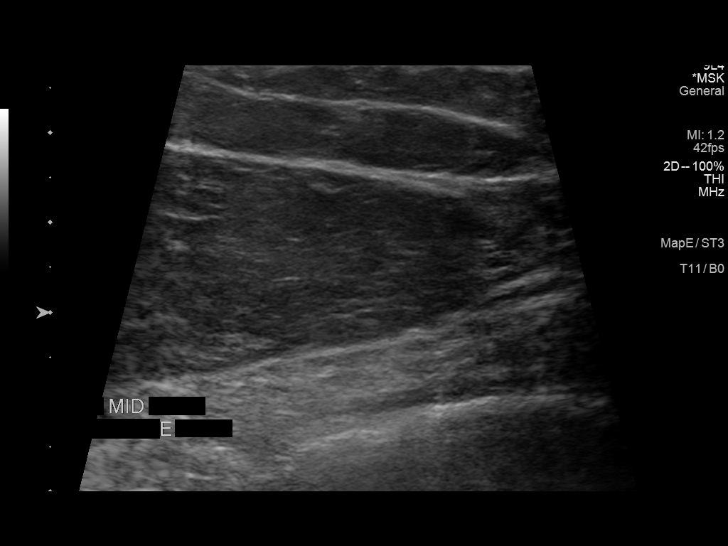
[im 7/11]
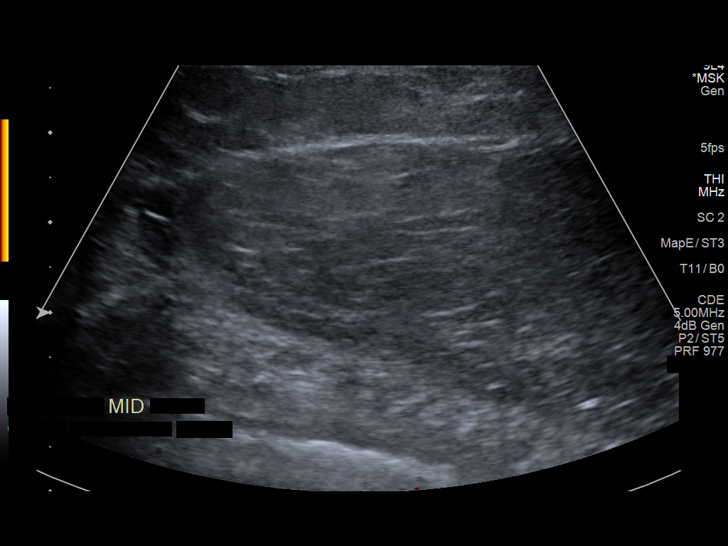
[im 8/11]
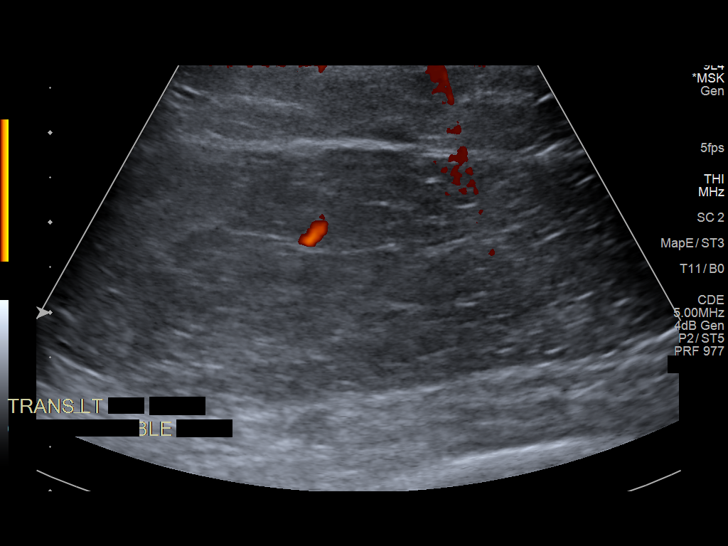
[im 9/11]
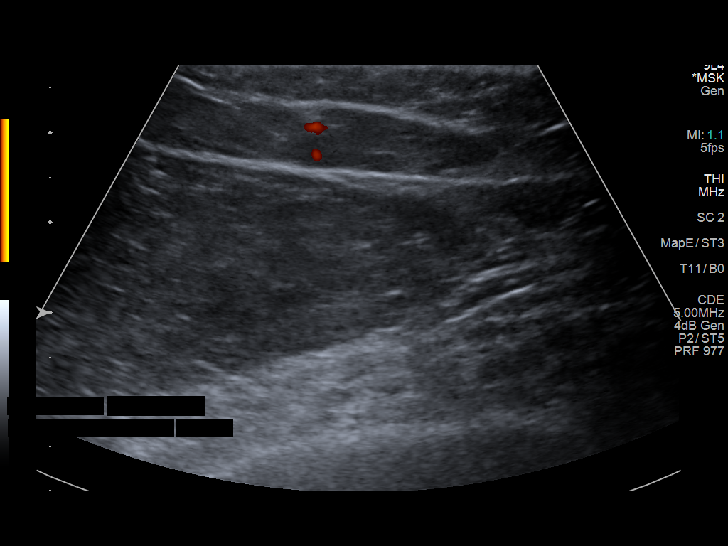
[im 10/11]
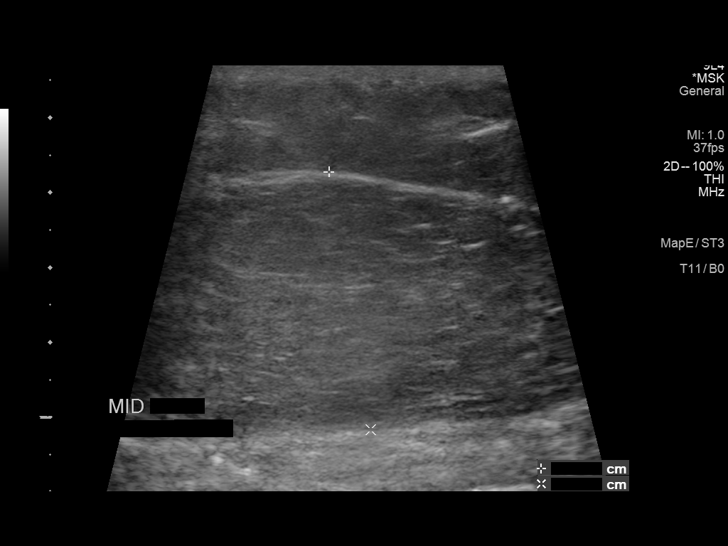
[im 11/11]
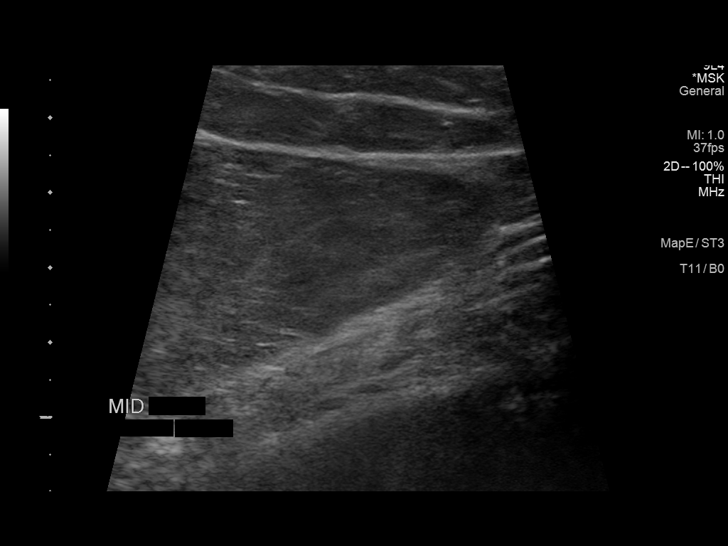

[11 of 11 positions shown; findings below may reference images not displayed]

FINDINGS: Sonographic evaluation of the palpable area in the left mid back was
performed. There is a circumscribed 8.6 x 2.5 x 5.2 cm mass
isoechoic to the adjacent subcutaneous fat, most compatible with
lipoma. This is located approximately 1.7 cm deep to the skin
surface.
IMPRESSION: 1. Well-circumscribed 8.6 cm mass within the left mid back,
isoechoic to subcutaneous fat, most consistent with lipoma.

## 2021-04-25 DIAGNOSIS — J3089 Other allergic rhinitis: Secondary | ICD-10-CM | POA: Diagnosis not present

## 2021-04-25 DIAGNOSIS — J301 Allergic rhinitis due to pollen: Secondary | ICD-10-CM | POA: Diagnosis not present

## 2021-04-30 DIAGNOSIS — F4320 Adjustment disorder, unspecified: Secondary | ICD-10-CM | POA: Diagnosis not present

## 2021-04-30 DIAGNOSIS — F410 Panic disorder [episodic paroxysmal anxiety] without agoraphobia: Secondary | ICD-10-CM | POA: Diagnosis not present

## 2021-04-30 DIAGNOSIS — J301 Allergic rhinitis due to pollen: Secondary | ICD-10-CM | POA: Diagnosis not present

## 2021-04-30 DIAGNOSIS — J3089 Other allergic rhinitis: Secondary | ICD-10-CM | POA: Diagnosis not present

## 2021-04-30 DIAGNOSIS — F413 Other mixed anxiety disorders: Secondary | ICD-10-CM | POA: Diagnosis not present

## 2021-04-30 DIAGNOSIS — F3289 Other specified depressive episodes: Secondary | ICD-10-CM | POA: Diagnosis not present

## 2021-04-30 DIAGNOSIS — F332 Major depressive disorder, recurrent severe without psychotic features: Secondary | ICD-10-CM | POA: Diagnosis not present

## 2021-05-07 DIAGNOSIS — J3089 Other allergic rhinitis: Secondary | ICD-10-CM | POA: Diagnosis not present

## 2021-05-07 DIAGNOSIS — J301 Allergic rhinitis due to pollen: Secondary | ICD-10-CM | POA: Diagnosis not present

## 2021-05-14 DIAGNOSIS — J301 Allergic rhinitis due to pollen: Secondary | ICD-10-CM | POA: Diagnosis not present

## 2021-05-14 DIAGNOSIS — J3089 Other allergic rhinitis: Secondary | ICD-10-CM | POA: Diagnosis not present

## 2021-05-16 DIAGNOSIS — R5383 Other fatigue: Secondary | ICD-10-CM | POA: Diagnosis not present

## 2021-05-16 DIAGNOSIS — Z862 Personal history of diseases of the blood and blood-forming organs and certain disorders involving the immune mechanism: Secondary | ICD-10-CM | POA: Diagnosis not present

## 2021-05-16 DIAGNOSIS — F411 Generalized anxiety disorder: Secondary | ICD-10-CM | POA: Diagnosis not present

## 2021-05-16 DIAGNOSIS — F331 Major depressive disorder, recurrent, moderate: Secondary | ICD-10-CM | POA: Diagnosis not present

## 2021-05-21 DIAGNOSIS — J3089 Other allergic rhinitis: Secondary | ICD-10-CM | POA: Diagnosis not present

## 2021-05-21 DIAGNOSIS — J301 Allergic rhinitis due to pollen: Secondary | ICD-10-CM | POA: Diagnosis not present

## 2021-05-28 DIAGNOSIS — J3089 Other allergic rhinitis: Secondary | ICD-10-CM | POA: Diagnosis not present

## 2021-05-28 DIAGNOSIS — J301 Allergic rhinitis due to pollen: Secondary | ICD-10-CM | POA: Diagnosis not present

## 2021-06-04 DIAGNOSIS — J301 Allergic rhinitis due to pollen: Secondary | ICD-10-CM | POA: Diagnosis not present

## 2021-06-04 DIAGNOSIS — J3089 Other allergic rhinitis: Secondary | ICD-10-CM | POA: Diagnosis not present

## 2021-06-12 DIAGNOSIS — J3089 Other allergic rhinitis: Secondary | ICD-10-CM | POA: Diagnosis not present

## 2021-06-12 DIAGNOSIS — J301 Allergic rhinitis due to pollen: Secondary | ICD-10-CM | POA: Diagnosis not present

## 2021-06-13 DIAGNOSIS — R739 Hyperglycemia, unspecified: Secondary | ICD-10-CM | POA: Diagnosis not present

## 2021-06-13 DIAGNOSIS — R946 Abnormal results of thyroid function studies: Secondary | ICD-10-CM | POA: Diagnosis not present

## 2021-06-13 DIAGNOSIS — Z13220 Encounter for screening for lipoid disorders: Secondary | ICD-10-CM | POA: Diagnosis not present

## 2021-06-13 DIAGNOSIS — R5382 Chronic fatigue, unspecified: Secondary | ICD-10-CM | POA: Diagnosis not present

## 2021-06-13 DIAGNOSIS — Z0000 Encounter for general adult medical examination without abnormal findings: Secondary | ICD-10-CM | POA: Diagnosis not present

## 2021-06-20 DIAGNOSIS — J3089 Other allergic rhinitis: Secondary | ICD-10-CM | POA: Diagnosis not present

## 2021-06-20 DIAGNOSIS — J301 Allergic rhinitis due to pollen: Secondary | ICD-10-CM | POA: Diagnosis not present

## 2021-06-26 DIAGNOSIS — J3089 Other allergic rhinitis: Secondary | ICD-10-CM | POA: Diagnosis not present

## 2021-06-26 DIAGNOSIS — J301 Allergic rhinitis due to pollen: Secondary | ICD-10-CM | POA: Diagnosis not present

## 2021-07-02 DIAGNOSIS — J301 Allergic rhinitis due to pollen: Secondary | ICD-10-CM | POA: Diagnosis not present

## 2021-07-02 DIAGNOSIS — J3089 Other allergic rhinitis: Secondary | ICD-10-CM | POA: Diagnosis not present

## 2021-07-11 DIAGNOSIS — J301 Allergic rhinitis due to pollen: Secondary | ICD-10-CM | POA: Diagnosis not present

## 2021-07-11 DIAGNOSIS — J3089 Other allergic rhinitis: Secondary | ICD-10-CM | POA: Diagnosis not present

## 2021-07-18 DIAGNOSIS — J3089 Other allergic rhinitis: Secondary | ICD-10-CM | POA: Diagnosis not present

## 2021-07-18 DIAGNOSIS — J301 Allergic rhinitis due to pollen: Secondary | ICD-10-CM | POA: Diagnosis not present

## 2021-07-24 DIAGNOSIS — J3089 Other allergic rhinitis: Secondary | ICD-10-CM | POA: Diagnosis not present

## 2021-07-24 DIAGNOSIS — J301 Allergic rhinitis due to pollen: Secondary | ICD-10-CM | POA: Diagnosis not present

## 2021-07-31 DIAGNOSIS — J3089 Other allergic rhinitis: Secondary | ICD-10-CM | POA: Diagnosis not present

## 2021-07-31 DIAGNOSIS — J301 Allergic rhinitis due to pollen: Secondary | ICD-10-CM | POA: Diagnosis not present

## 2021-08-07 DIAGNOSIS — J3089 Other allergic rhinitis: Secondary | ICD-10-CM | POA: Diagnosis not present

## 2021-08-07 DIAGNOSIS — J301 Allergic rhinitis due to pollen: Secondary | ICD-10-CM | POA: Diagnosis not present

## 2021-08-14 DIAGNOSIS — J301 Allergic rhinitis due to pollen: Secondary | ICD-10-CM | POA: Diagnosis not present

## 2021-08-14 DIAGNOSIS — J3089 Other allergic rhinitis: Secondary | ICD-10-CM | POA: Diagnosis not present

## 2021-08-21 DIAGNOSIS — J3089 Other allergic rhinitis: Secondary | ICD-10-CM | POA: Diagnosis not present

## 2021-08-21 DIAGNOSIS — J301 Allergic rhinitis due to pollen: Secondary | ICD-10-CM | POA: Diagnosis not present

## 2021-08-28 DIAGNOSIS — J301 Allergic rhinitis due to pollen: Secondary | ICD-10-CM | POA: Diagnosis not present

## 2021-08-28 DIAGNOSIS — J3089 Other allergic rhinitis: Secondary | ICD-10-CM | POA: Diagnosis not present

## 2021-09-03 DIAGNOSIS — J301 Allergic rhinitis due to pollen: Secondary | ICD-10-CM | POA: Diagnosis not present

## 2021-09-04 DIAGNOSIS — J3089 Other allergic rhinitis: Secondary | ICD-10-CM | POA: Diagnosis not present

## 2021-09-04 DIAGNOSIS — J301 Allergic rhinitis due to pollen: Secondary | ICD-10-CM | POA: Diagnosis not present

## 2021-09-11 DIAGNOSIS — F332 Major depressive disorder, recurrent severe without psychotic features: Secondary | ICD-10-CM | POA: Diagnosis not present

## 2021-09-11 DIAGNOSIS — F3289 Other specified depressive episodes: Secondary | ICD-10-CM | POA: Diagnosis not present

## 2021-09-11 DIAGNOSIS — F331 Major depressive disorder, recurrent, moderate: Secondary | ICD-10-CM | POA: Diagnosis not present

## 2021-09-11 DIAGNOSIS — J3089 Other allergic rhinitis: Secondary | ICD-10-CM | POA: Diagnosis not present

## 2021-09-11 DIAGNOSIS — F413 Other mixed anxiety disorders: Secondary | ICD-10-CM | POA: Diagnosis not present

## 2021-09-11 DIAGNOSIS — J301 Allergic rhinitis due to pollen: Secondary | ICD-10-CM | POA: Diagnosis not present

## 2021-09-17 DIAGNOSIS — J3089 Other allergic rhinitis: Secondary | ICD-10-CM | POA: Diagnosis not present

## 2021-09-17 DIAGNOSIS — J301 Allergic rhinitis due to pollen: Secondary | ICD-10-CM | POA: Diagnosis not present

## 2021-09-22 DIAGNOSIS — F332 Major depressive disorder, recurrent severe without psychotic features: Secondary | ICD-10-CM | POA: Diagnosis not present

## 2021-09-22 DIAGNOSIS — F413 Other mixed anxiety disorders: Secondary | ICD-10-CM | POA: Diagnosis not present

## 2021-09-24 DIAGNOSIS — J301 Allergic rhinitis due to pollen: Secondary | ICD-10-CM | POA: Diagnosis not present

## 2021-09-24 DIAGNOSIS — J3089 Other allergic rhinitis: Secondary | ICD-10-CM | POA: Diagnosis not present

## 2021-10-02 DIAGNOSIS — J3089 Other allergic rhinitis: Secondary | ICD-10-CM | POA: Diagnosis not present

## 2021-10-02 DIAGNOSIS — J301 Allergic rhinitis due to pollen: Secondary | ICD-10-CM | POA: Diagnosis not present

## 2021-10-08 DIAGNOSIS — J301 Allergic rhinitis due to pollen: Secondary | ICD-10-CM | POA: Diagnosis not present

## 2021-10-08 DIAGNOSIS — J3089 Other allergic rhinitis: Secondary | ICD-10-CM | POA: Diagnosis not present

## 2021-10-15 DIAGNOSIS — J3081 Allergic rhinitis due to animal (cat) (dog) hair and dander: Secondary | ICD-10-CM | POA: Diagnosis not present

## 2021-10-15 DIAGNOSIS — J301 Allergic rhinitis due to pollen: Secondary | ICD-10-CM | POA: Diagnosis not present

## 2021-10-15 DIAGNOSIS — J3089 Other allergic rhinitis: Secondary | ICD-10-CM | POA: Diagnosis not present

## 2021-10-29 DIAGNOSIS — J3081 Allergic rhinitis due to animal (cat) (dog) hair and dander: Secondary | ICD-10-CM | POA: Diagnosis not present

## 2021-10-29 DIAGNOSIS — J301 Allergic rhinitis due to pollen: Secondary | ICD-10-CM | POA: Diagnosis not present

## 2021-10-29 DIAGNOSIS — J3089 Other allergic rhinitis: Secondary | ICD-10-CM | POA: Diagnosis not present

## 2021-10-30 DIAGNOSIS — F332 Major depressive disorder, recurrent severe without psychotic features: Secondary | ICD-10-CM | POA: Diagnosis not present

## 2021-10-30 DIAGNOSIS — F413 Other mixed anxiety disorders: Secondary | ICD-10-CM | POA: Diagnosis not present

## 2021-10-30 DIAGNOSIS — F4320 Adjustment disorder, unspecified: Secondary | ICD-10-CM | POA: Diagnosis not present

## 2021-11-05 DIAGNOSIS — J301 Allergic rhinitis due to pollen: Secondary | ICD-10-CM | POA: Diagnosis not present

## 2021-11-05 DIAGNOSIS — J3089 Other allergic rhinitis: Secondary | ICD-10-CM | POA: Diagnosis not present

## 2021-11-06 DIAGNOSIS — Z309 Encounter for contraceptive management, unspecified: Secondary | ICD-10-CM | POA: Diagnosis not present

## 2021-11-06 DIAGNOSIS — Z1231 Encounter for screening mammogram for malignant neoplasm of breast: Secondary | ICD-10-CM | POA: Diagnosis not present

## 2021-11-06 DIAGNOSIS — Z6836 Body mass index (BMI) 36.0-36.9, adult: Secondary | ICD-10-CM | POA: Diagnosis not present

## 2021-11-06 DIAGNOSIS — Z124 Encounter for screening for malignant neoplasm of cervix: Secondary | ICD-10-CM | POA: Diagnosis not present

## 2021-11-06 DIAGNOSIS — Z01419 Encounter for gynecological examination (general) (routine) without abnormal findings: Secondary | ICD-10-CM | POA: Diagnosis not present

## 2021-11-12 DIAGNOSIS — J3081 Allergic rhinitis due to animal (cat) (dog) hair and dander: Secondary | ICD-10-CM | POA: Diagnosis not present

## 2021-11-12 DIAGNOSIS — J301 Allergic rhinitis due to pollen: Secondary | ICD-10-CM | POA: Diagnosis not present

## 2021-11-12 DIAGNOSIS — J3089 Other allergic rhinitis: Secondary | ICD-10-CM | POA: Diagnosis not present

## 2021-11-20 DIAGNOSIS — J3089 Other allergic rhinitis: Secondary | ICD-10-CM | POA: Diagnosis not present

## 2021-11-20 DIAGNOSIS — J301 Allergic rhinitis due to pollen: Secondary | ICD-10-CM | POA: Diagnosis not present

## 2021-11-26 DIAGNOSIS — J301 Allergic rhinitis due to pollen: Secondary | ICD-10-CM | POA: Diagnosis not present

## 2021-11-26 DIAGNOSIS — J3089 Other allergic rhinitis: Secondary | ICD-10-CM | POA: Diagnosis not present

## 2021-11-26 DIAGNOSIS — J3081 Allergic rhinitis due to animal (cat) (dog) hair and dander: Secondary | ICD-10-CM | POA: Diagnosis not present

## 2021-12-04 DIAGNOSIS — J3089 Other allergic rhinitis: Secondary | ICD-10-CM | POA: Diagnosis not present

## 2021-12-04 DIAGNOSIS — J301 Allergic rhinitis due to pollen: Secondary | ICD-10-CM | POA: Diagnosis not present

## 2021-12-04 DIAGNOSIS — J3081 Allergic rhinitis due to animal (cat) (dog) hair and dander: Secondary | ICD-10-CM | POA: Diagnosis not present

## 2021-12-10 DIAGNOSIS — J3089 Other allergic rhinitis: Secondary | ICD-10-CM | POA: Diagnosis not present

## 2021-12-10 DIAGNOSIS — J301 Allergic rhinitis due to pollen: Secondary | ICD-10-CM | POA: Diagnosis not present

## 2021-12-10 DIAGNOSIS — J3081 Allergic rhinitis due to animal (cat) (dog) hair and dander: Secondary | ICD-10-CM | POA: Diagnosis not present

## 2021-12-17 DIAGNOSIS — J301 Allergic rhinitis due to pollen: Secondary | ICD-10-CM | POA: Diagnosis not present

## 2021-12-17 DIAGNOSIS — J3089 Other allergic rhinitis: Secondary | ICD-10-CM | POA: Diagnosis not present

## 2021-12-25 DIAGNOSIS — J3089 Other allergic rhinitis: Secondary | ICD-10-CM | POA: Diagnosis not present

## 2021-12-25 DIAGNOSIS — J3081 Allergic rhinitis due to animal (cat) (dog) hair and dander: Secondary | ICD-10-CM | POA: Diagnosis not present

## 2021-12-25 DIAGNOSIS — J301 Allergic rhinitis due to pollen: Secondary | ICD-10-CM | POA: Diagnosis not present

## 2022-01-01 DIAGNOSIS — J301 Allergic rhinitis due to pollen: Secondary | ICD-10-CM | POA: Diagnosis not present

## 2022-01-01 DIAGNOSIS — J3089 Other allergic rhinitis: Secondary | ICD-10-CM | POA: Diagnosis not present

## 2022-01-06 DIAGNOSIS — J301 Allergic rhinitis due to pollen: Secondary | ICD-10-CM | POA: Diagnosis not present

## 2022-01-06 DIAGNOSIS — J3081 Allergic rhinitis due to animal (cat) (dog) hair and dander: Secondary | ICD-10-CM | POA: Diagnosis not present

## 2022-01-06 DIAGNOSIS — J3089 Other allergic rhinitis: Secondary | ICD-10-CM | POA: Diagnosis not present

## 2022-01-15 DIAGNOSIS — J301 Allergic rhinitis due to pollen: Secondary | ICD-10-CM | POA: Diagnosis not present

## 2022-01-15 DIAGNOSIS — J3081 Allergic rhinitis due to animal (cat) (dog) hair and dander: Secondary | ICD-10-CM | POA: Diagnosis not present

## 2022-01-15 DIAGNOSIS — J3089 Other allergic rhinitis: Secondary | ICD-10-CM | POA: Diagnosis not present

## 2022-01-21 DIAGNOSIS — J3081 Allergic rhinitis due to animal (cat) (dog) hair and dander: Secondary | ICD-10-CM | POA: Diagnosis not present

## 2022-01-21 DIAGNOSIS — J3089 Other allergic rhinitis: Secondary | ICD-10-CM | POA: Diagnosis not present

## 2022-01-21 DIAGNOSIS — J301 Allergic rhinitis due to pollen: Secondary | ICD-10-CM | POA: Diagnosis not present

## 2022-01-28 DIAGNOSIS — J3089 Other allergic rhinitis: Secondary | ICD-10-CM | POA: Diagnosis not present

## 2022-01-28 DIAGNOSIS — J301 Allergic rhinitis due to pollen: Secondary | ICD-10-CM | POA: Diagnosis not present

## 2022-01-28 DIAGNOSIS — J3081 Allergic rhinitis due to animal (cat) (dog) hair and dander: Secondary | ICD-10-CM | POA: Diagnosis not present

## 2022-02-04 DIAGNOSIS — J3081 Allergic rhinitis due to animal (cat) (dog) hair and dander: Secondary | ICD-10-CM | POA: Diagnosis not present

## 2022-02-04 DIAGNOSIS — J3089 Other allergic rhinitis: Secondary | ICD-10-CM | POA: Diagnosis not present

## 2022-02-04 DIAGNOSIS — J301 Allergic rhinitis due to pollen: Secondary | ICD-10-CM | POA: Diagnosis not present

## 2022-02-10 DIAGNOSIS — J3081 Allergic rhinitis due to animal (cat) (dog) hair and dander: Secondary | ICD-10-CM | POA: Diagnosis not present

## 2022-02-10 DIAGNOSIS — J301 Allergic rhinitis due to pollen: Secondary | ICD-10-CM | POA: Diagnosis not present

## 2022-02-10 DIAGNOSIS — J3089 Other allergic rhinitis: Secondary | ICD-10-CM | POA: Diagnosis not present

## 2022-02-11 DIAGNOSIS — Z1211 Encounter for screening for malignant neoplasm of colon: Secondary | ICD-10-CM | POA: Diagnosis not present

## 2022-02-11 DIAGNOSIS — K644 Residual hemorrhoidal skin tags: Secondary | ICD-10-CM | POA: Diagnosis not present

## 2022-02-11 DIAGNOSIS — D120 Benign neoplasm of cecum: Secondary | ICD-10-CM | POA: Diagnosis not present

## 2022-02-18 DIAGNOSIS — J3089 Other allergic rhinitis: Secondary | ICD-10-CM | POA: Diagnosis not present

## 2022-02-18 DIAGNOSIS — J3081 Allergic rhinitis due to animal (cat) (dog) hair and dander: Secondary | ICD-10-CM | POA: Diagnosis not present

## 2022-02-18 DIAGNOSIS — J301 Allergic rhinitis due to pollen: Secondary | ICD-10-CM | POA: Diagnosis not present

## 2022-02-26 DIAGNOSIS — J3089 Other allergic rhinitis: Secondary | ICD-10-CM | POA: Diagnosis not present

## 2022-02-26 DIAGNOSIS — J3081 Allergic rhinitis due to animal (cat) (dog) hair and dander: Secondary | ICD-10-CM | POA: Diagnosis not present

## 2022-02-26 DIAGNOSIS — J301 Allergic rhinitis due to pollen: Secondary | ICD-10-CM | POA: Diagnosis not present

## 2022-03-04 DIAGNOSIS — T781XXD Other adverse food reactions, not elsewhere classified, subsequent encounter: Secondary | ICD-10-CM | POA: Diagnosis not present

## 2022-03-04 DIAGNOSIS — J3089 Other allergic rhinitis: Secondary | ICD-10-CM | POA: Diagnosis not present

## 2022-03-04 DIAGNOSIS — R062 Wheezing: Secondary | ICD-10-CM | POA: Diagnosis not present

## 2022-03-04 DIAGNOSIS — J301 Allergic rhinitis due to pollen: Secondary | ICD-10-CM | POA: Diagnosis not present

## 2022-03-11 DIAGNOSIS — J3089 Other allergic rhinitis: Secondary | ICD-10-CM | POA: Diagnosis not present

## 2022-03-11 DIAGNOSIS — J301 Allergic rhinitis due to pollen: Secondary | ICD-10-CM | POA: Diagnosis not present

## 2022-03-13 DIAGNOSIS — J3089 Other allergic rhinitis: Secondary | ICD-10-CM | POA: Diagnosis not present

## 2022-03-13 DIAGNOSIS — J301 Allergic rhinitis due to pollen: Secondary | ICD-10-CM | POA: Diagnosis not present

## 2022-03-13 DIAGNOSIS — J3081 Allergic rhinitis due to animal (cat) (dog) hair and dander: Secondary | ICD-10-CM | POA: Diagnosis not present

## 2022-03-19 DIAGNOSIS — J301 Allergic rhinitis due to pollen: Secondary | ICD-10-CM | POA: Diagnosis not present

## 2022-03-19 DIAGNOSIS — J3089 Other allergic rhinitis: Secondary | ICD-10-CM | POA: Diagnosis not present

## 2022-03-19 DIAGNOSIS — J3081 Allergic rhinitis due to animal (cat) (dog) hair and dander: Secondary | ICD-10-CM | POA: Diagnosis not present

## 2022-03-26 DIAGNOSIS — J3089 Other allergic rhinitis: Secondary | ICD-10-CM | POA: Diagnosis not present

## 2022-03-26 DIAGNOSIS — J301 Allergic rhinitis due to pollen: Secondary | ICD-10-CM | POA: Diagnosis not present

## 2022-04-01 DIAGNOSIS — J3089 Other allergic rhinitis: Secondary | ICD-10-CM | POA: Diagnosis not present

## 2022-04-01 DIAGNOSIS — J301 Allergic rhinitis due to pollen: Secondary | ICD-10-CM | POA: Diagnosis not present

## 2022-04-01 DIAGNOSIS — J3081 Allergic rhinitis due to animal (cat) (dog) hair and dander: Secondary | ICD-10-CM | POA: Diagnosis not present

## 2022-04-08 DIAGNOSIS — J301 Allergic rhinitis due to pollen: Secondary | ICD-10-CM | POA: Diagnosis not present

## 2022-04-08 DIAGNOSIS — J3081 Allergic rhinitis due to animal (cat) (dog) hair and dander: Secondary | ICD-10-CM | POA: Diagnosis not present

## 2022-04-08 DIAGNOSIS — J3089 Other allergic rhinitis: Secondary | ICD-10-CM | POA: Diagnosis not present

## 2022-04-15 DIAGNOSIS — J3089 Other allergic rhinitis: Secondary | ICD-10-CM | POA: Diagnosis not present

## 2022-04-15 DIAGNOSIS — J301 Allergic rhinitis due to pollen: Secondary | ICD-10-CM | POA: Diagnosis not present

## 2022-04-22 DIAGNOSIS — J3089 Other allergic rhinitis: Secondary | ICD-10-CM | POA: Diagnosis not present

## 2022-04-22 DIAGNOSIS — J301 Allergic rhinitis due to pollen: Secondary | ICD-10-CM | POA: Diagnosis not present

## 2022-04-29 DIAGNOSIS — J301 Allergic rhinitis due to pollen: Secondary | ICD-10-CM | POA: Diagnosis not present

## 2022-04-29 DIAGNOSIS — J3081 Allergic rhinitis due to animal (cat) (dog) hair and dander: Secondary | ICD-10-CM | POA: Diagnosis not present

## 2022-04-30 DIAGNOSIS — F413 Other mixed anxiety disorders: Secondary | ICD-10-CM | POA: Diagnosis not present

## 2022-04-30 DIAGNOSIS — F332 Major depressive disorder, recurrent severe without psychotic features: Secondary | ICD-10-CM | POA: Diagnosis not present

## 2022-05-05 DIAGNOSIS — J3081 Allergic rhinitis due to animal (cat) (dog) hair and dander: Secondary | ICD-10-CM | POA: Diagnosis not present

## 2022-05-05 DIAGNOSIS — Z23 Encounter for immunization: Secondary | ICD-10-CM | POA: Diagnosis not present

## 2022-05-11 DIAGNOSIS — J301 Allergic rhinitis due to pollen: Secondary | ICD-10-CM | POA: Diagnosis not present

## 2022-05-11 DIAGNOSIS — J3081 Allergic rhinitis due to animal (cat) (dog) hair and dander: Secondary | ICD-10-CM | POA: Diagnosis not present

## 2022-05-11 DIAGNOSIS — J3089 Other allergic rhinitis: Secondary | ICD-10-CM | POA: Diagnosis not present

## 2022-05-18 DIAGNOSIS — J301 Allergic rhinitis due to pollen: Secondary | ICD-10-CM | POA: Diagnosis not present

## 2022-05-18 DIAGNOSIS — J3089 Other allergic rhinitis: Secondary | ICD-10-CM | POA: Diagnosis not present

## 2022-05-18 DIAGNOSIS — J3081 Allergic rhinitis due to animal (cat) (dog) hair and dander: Secondary | ICD-10-CM | POA: Diagnosis not present

## 2022-05-20 DIAGNOSIS — H6980 Other specified disorders of Eustachian tube, unspecified ear: Secondary | ICD-10-CM | POA: Diagnosis not present

## 2022-05-20 DIAGNOSIS — R0981 Nasal congestion: Secondary | ICD-10-CM | POA: Diagnosis not present

## 2022-05-20 DIAGNOSIS — J3089 Other allergic rhinitis: Secondary | ICD-10-CM | POA: Diagnosis not present

## 2022-05-26 DIAGNOSIS — J301 Allergic rhinitis due to pollen: Secondary | ICD-10-CM | POA: Diagnosis not present

## 2022-05-26 DIAGNOSIS — J3089 Other allergic rhinitis: Secondary | ICD-10-CM | POA: Diagnosis not present

## 2022-05-27 DIAGNOSIS — J301 Allergic rhinitis due to pollen: Secondary | ICD-10-CM | POA: Diagnosis not present

## 2022-05-28 DIAGNOSIS — J3089 Other allergic rhinitis: Secondary | ICD-10-CM | POA: Diagnosis not present

## 2022-06-03 DIAGNOSIS — J301 Allergic rhinitis due to pollen: Secondary | ICD-10-CM | POA: Diagnosis not present

## 2022-06-03 DIAGNOSIS — J3089 Other allergic rhinitis: Secondary | ICD-10-CM | POA: Diagnosis not present

## 2022-06-10 DIAGNOSIS — J301 Allergic rhinitis due to pollen: Secondary | ICD-10-CM | POA: Diagnosis not present

## 2022-06-10 DIAGNOSIS — J3089 Other allergic rhinitis: Secondary | ICD-10-CM | POA: Diagnosis not present

## 2022-06-17 DIAGNOSIS — J301 Allergic rhinitis due to pollen: Secondary | ICD-10-CM | POA: Diagnosis not present

## 2022-06-17 DIAGNOSIS — J3089 Other allergic rhinitis: Secondary | ICD-10-CM | POA: Diagnosis not present

## 2022-06-24 DIAGNOSIS — J301 Allergic rhinitis due to pollen: Secondary | ICD-10-CM | POA: Diagnosis not present

## 2022-06-24 DIAGNOSIS — J3089 Other allergic rhinitis: Secondary | ICD-10-CM | POA: Diagnosis not present

## 2022-06-24 DIAGNOSIS — J3081 Allergic rhinitis due to animal (cat) (dog) hair and dander: Secondary | ICD-10-CM | POA: Diagnosis not present

## 2022-07-01 DIAGNOSIS — J3089 Other allergic rhinitis: Secondary | ICD-10-CM | POA: Diagnosis not present

## 2022-07-01 DIAGNOSIS — J3081 Allergic rhinitis due to animal (cat) (dog) hair and dander: Secondary | ICD-10-CM | POA: Diagnosis not present

## 2022-07-01 DIAGNOSIS — J301 Allergic rhinitis due to pollen: Secondary | ICD-10-CM | POA: Diagnosis not present

## 2022-07-14 DIAGNOSIS — J3081 Allergic rhinitis due to animal (cat) (dog) hair and dander: Secondary | ICD-10-CM | POA: Diagnosis not present

## 2022-07-14 DIAGNOSIS — J3089 Other allergic rhinitis: Secondary | ICD-10-CM | POA: Diagnosis not present

## 2022-07-14 DIAGNOSIS — J301 Allergic rhinitis due to pollen: Secondary | ICD-10-CM | POA: Diagnosis not present

## 2022-07-20 DIAGNOSIS — Z0000 Encounter for general adult medical examination without abnormal findings: Secondary | ICD-10-CM | POA: Diagnosis not present

## 2022-07-20 DIAGNOSIS — J3081 Allergic rhinitis due to animal (cat) (dog) hair and dander: Secondary | ICD-10-CM | POA: Diagnosis not present

## 2022-07-20 DIAGNOSIS — Z79899 Other long term (current) drug therapy: Secondary | ICD-10-CM | POA: Diagnosis not present

## 2022-07-20 DIAGNOSIS — R7303 Prediabetes: Secondary | ICD-10-CM | POA: Diagnosis not present

## 2022-07-20 DIAGNOSIS — J3089 Other allergic rhinitis: Secondary | ICD-10-CM | POA: Diagnosis not present

## 2022-07-20 DIAGNOSIS — J301 Allergic rhinitis due to pollen: Secondary | ICD-10-CM | POA: Diagnosis not present

## 2022-07-30 ENCOUNTER — Encounter (HOSPITAL_COMMUNITY)
Admission: EM | Disposition: A | Discharge status: Home / Self Care | Payer: Self-pay | Source: Home / Self Care | Attending: Emergency Medicine

## 2022-07-30 ENCOUNTER — Encounter (HOSPITAL_COMMUNITY): Payer: Self-pay

## 2022-07-30 ENCOUNTER — Emergency Department (HOSPITAL_COMMUNITY): Payer: BC Managed Care – PPO | Admitting: Certified Registered"

## 2022-07-30 ENCOUNTER — Emergency Department (HOSPITAL_COMMUNITY): Payer: BC Managed Care – PPO

## 2022-07-30 ENCOUNTER — Emergency Department (HOSPITAL_COMMUNITY)
Admission: EM | Admit: 2022-07-30 | Discharge: 2022-07-30 | Disposition: A | Discharge status: Home / Self Care | Payer: BC Managed Care – PPO | Attending: Student | Admitting: Student

## 2022-07-30 DIAGNOSIS — R1084 Generalized abdominal pain: Secondary | ICD-10-CM | POA: Diagnosis not present

## 2022-07-30 DIAGNOSIS — K2970 Gastritis, unspecified, without bleeding: Secondary | ICD-10-CM | POA: Diagnosis not present

## 2022-07-30 DIAGNOSIS — N200 Calculus of kidney: Secondary | ICD-10-CM | POA: Diagnosis not present

## 2022-07-30 DIAGNOSIS — Z7951 Long term (current) use of inhaled steroids: Secondary | ICD-10-CM | POA: Insufficient documentation

## 2022-07-30 DIAGNOSIS — K760 Fatty (change of) liver, not elsewhere classified: Secondary | ICD-10-CM | POA: Diagnosis not present

## 2022-07-30 DIAGNOSIS — N201 Calculus of ureter: Secondary | ICD-10-CM

## 2022-07-30 DIAGNOSIS — R109 Unspecified abdominal pain: Secondary | ICD-10-CM | POA: Diagnosis not present

## 2022-07-30 DIAGNOSIS — I10 Essential (primary) hypertension: Secondary | ICD-10-CM | POA: Diagnosis not present

## 2022-07-30 DIAGNOSIS — K769 Liver disease, unspecified: Secondary | ICD-10-CM | POA: Diagnosis not present

## 2022-07-30 DIAGNOSIS — N132 Hydronephrosis with renal and ureteral calculous obstruction: Secondary | ICD-10-CM | POA: Diagnosis not present

## 2022-07-30 DIAGNOSIS — R1011 Right upper quadrant pain: Secondary | ICD-10-CM | POA: Diagnosis not present

## 2022-07-30 DIAGNOSIS — K8020 Calculus of gallbladder without cholecystitis without obstruction: Secondary | ICD-10-CM | POA: Diagnosis not present

## 2022-07-30 DIAGNOSIS — J45909 Unspecified asthma, uncomplicated: Secondary | ICD-10-CM | POA: Insufficient documentation

## 2022-07-30 LAB — URINALYSIS, ROUTINE W REFLEX MICROSCOPIC
Bilirubin Urine: NEGATIVE
Glucose, UA: NEGATIVE mg/dL
Ketones, ur: 20 mg/dL — AB
Leukocytes,Ua: NEGATIVE
Nitrite: NEGATIVE
Protein, ur: 30 mg/dL — AB
Specific Gravity, Urine: 1.021 (ref 1.005–1.030)
pH: 6 (ref 5.0–8.0)

## 2022-07-30 LAB — COMPREHENSIVE METABOLIC PANEL
ALT: 16 U/L (ref 0–44)
AST: 21 U/L (ref 15–41)
Albumin: 3.8 g/dL (ref 3.5–5.0)
Alkaline Phosphatase: 43 U/L (ref 38–126)
Anion gap: 9 (ref 5–15)
BUN: 20 mg/dL (ref 6–20)
CO2: 25 mmol/L (ref 22–32)
Calcium: 8.9 mg/dL (ref 8.9–10.3)
Chloride: 101 mmol/L (ref 98–111)
Creatinine, Ser: 1.61 mg/dL — ABNORMAL HIGH (ref 0.44–1.00)
GFR, Estimated: 39 mL/min — ABNORMAL LOW (ref >60)
Glucose, Bld: 121 mg/dL — ABNORMAL HIGH (ref 70–99)
Potassium: 3.8 mmol/L (ref 3.5–5.1)
Sodium: 135 mmol/L (ref 135–145)
Total Bilirubin: 0.5 mg/dL (ref 0.3–1.2)
Total Protein: 7.6 g/dL (ref 6.5–8.1)

## 2022-07-30 LAB — CBC WITH DIFFERENTIAL/PLATELET
Abs Immature Granulocytes: 0.05 10*3/uL (ref 0.00–0.07)
Basophils Absolute: 0 10*3/uL (ref 0.0–0.1)
Basophils Relative: 0 %
Eosinophils Absolute: 0 10*3/uL (ref 0.0–0.5)
Eosinophils Relative: 0 %
HCT: 45.7 % (ref 36.0–46.0)
Hemoglobin: 14.4 g/dL (ref 12.0–15.0)
Immature Granulocytes: 0 %
Lymphocytes Relative: 11 %
Lymphs Abs: 1.4 10*3/uL (ref 0.7–4.0)
MCH: 26.2 pg (ref 26.0–34.0)
MCHC: 31.5 g/dL (ref 30.0–36.0)
MCV: 83.1 fL (ref 80.0–100.0)
Monocytes Absolute: 1 10*3/uL (ref 0.1–1.0)
Monocytes Relative: 8 %
Neutro Abs: 9.8 10*3/uL — ABNORMAL HIGH (ref 1.7–7.7)
Neutrophils Relative %: 81 %
Platelets: 296 10*3/uL (ref 150–400)
RBC: 5.5 MIL/uL — ABNORMAL HIGH (ref 3.87–5.11)
RDW: 13.6 % (ref 11.5–15.5)
WBC: 12.2 10*3/uL — ABNORMAL HIGH (ref 4.0–10.5)
nRBC: 0 % (ref 0.0–0.2)

## 2022-07-30 LAB — I-STAT BETA HCG BLOOD, ED (MC, WL, AP ONLY): I-stat hCG, quantitative: <5 m[IU]/mL (ref <5)

## 2022-07-30 LAB — LIPASE, BLOOD: Lipase: 63 U/L — ABNORMAL HIGH (ref 11–51)

## 2022-07-30 SURGERY — LAPAROSCOPIC CHOLECYSTECTOMY
Anesthesia: General

## 2022-07-30 MED ORDER — SODIUM CHLORIDE 0.9 % IV SOLN
1.0000 g | Freq: Once | INTRAVENOUS | Status: DC
  Filled 2022-07-30: qty 10

## 2022-07-30 MED ORDER — PHENYLEPHRINE HCL (PRESSORS) 10 MG/ML IV SOLN
INTRAVENOUS | Status: AC
  Filled 2022-07-30: qty 1

## 2022-07-30 MED ORDER — OXYCODONE-ACETAMINOPHEN 5-325 MG PO TABS
1.0000 | ORAL_TABLET | Freq: Once | ORAL | Status: AC
  Administered 2022-07-30: 1 via ORAL
  Filled 2022-07-30: qty 1

## 2022-07-30 MED ORDER — BUPIVACAINE HCL (PF) 0.25 % IJ SOLN
INTRAMUSCULAR | Status: AC
  Filled 2022-07-30: qty 30

## 2022-07-30 MED ORDER — ONDANSETRON 4 MG PO TBDP
4.0000 mg | ORAL_TABLET | Freq: Once | ORAL | Status: AC
  Administered 2022-07-30: 4 mg via ORAL

## 2022-07-30 MED ORDER — METRONIDAZOLE 500 MG/100ML IV SOLN
500.0000 mg | Freq: Two times a day (BID) | INTRAVENOUS | Status: DC
  Filled 2022-07-30: qty 100

## 2022-07-30 MED ORDER — LACTATED RINGERS IV BOLUS
1000.0000 mL | Freq: Once | INTRAVENOUS | Status: AC
  Administered 2022-07-30: 1000 mL via INTRAVENOUS

## 2022-07-30 MED ORDER — CHLORHEXIDINE GLUCONATE CLOTH 2 % EX PADS: 6.0000 | MEDICATED_PAD | Freq: Once | CUTANEOUS | Status: DC

## 2022-07-30 MED ORDER — MIDAZOLAM HCL 2 MG/2ML IJ SOLN
INTRAMUSCULAR | Status: AC
  Filled 2022-07-30: qty 2

## 2022-07-30 MED ORDER — FENTANYL CITRATE (PF) 250 MCG/5ML IJ SOLN
INTRAMUSCULAR | Status: AC
  Filled 2022-07-30: qty 5

## 2022-07-30 MED ORDER — OXYCODONE-ACETAMINOPHEN 5-325 MG PO TABS: 1.0000 | ORAL_TABLET | Freq: Four times a day (QID) | ORAL | 0 refills | Status: AC | PRN

## 2022-07-30 MED ORDER — ONDANSETRON 4 MG PO TBDP: 4.0000 mg | ORAL_TABLET | Freq: Three times a day (TID) | ORAL | 0 refills | Status: AC | PRN

## 2022-07-30 MED ORDER — TAMSULOSIN HCL 0.4 MG PO CAPS
0.4000 mg | ORAL_CAPSULE | Freq: Once | ORAL | Status: AC
  Administered 2022-07-30: 0.4 mg via ORAL
  Filled 2022-07-30: qty 1

## 2022-07-30 MED ORDER — SENNOSIDES-DOCUSATE SODIUM 8.6-50 MG PO TABS: 1.0000 | ORAL_TABLET | Freq: Every evening | ORAL | 0 refills | Status: AC | PRN

## 2022-07-30 MED ORDER — TAMSULOSIN HCL 0.4 MG PO CAPS: 0.4000 mg | ORAL_CAPSULE | Freq: Every day | ORAL | 0 refills | Status: AC

## 2022-07-30 MED ORDER — KETOROLAC TROMETHAMINE 15 MG/ML IJ SOLN
15.0000 mg | Freq: Once | INTRAMUSCULAR | Status: AC
  Administered 2022-07-30: 15 mg via INTRAVENOUS
  Filled 2022-07-30: qty 1

## 2022-07-30 SURGICAL SUPPLY — 40 items
ADH SKN CLS APL DERMABOND .7 (GAUZE/BANDAGES/DRESSINGS)
APL PRP STRL LF DISP 70% ISPRP (MISCELLANEOUS)
APPLIER CLIP 5 13 M/L LIGAMAX5 (MISCELLANEOUS)
APR CLP MED LRG 5 ANG JAW (MISCELLANEOUS)
BAG COUNTER SPONGE SURGICOUNT (BAG) IMPLANT
BAG SPEC RTRVL 10 TROC 200 (ENDOMECHANICALS)
BAG SPNG CNTER NS LX DISP (BAG)
CABLE HIGH FREQUENCY MONO STRZ (ELECTRODE) ×1 IMPLANT
CHLORAPREP W/TINT 26 (MISCELLANEOUS) ×1 IMPLANT
CLIP APPLIE 5 13 M/L LIGAMAX5 (MISCELLANEOUS) IMPLANT
CLIP LIGATING HEMO O LOK GREEN (MISCELLANEOUS) ×1 IMPLANT
COVER MAYO STAND XLG (MISCELLANEOUS) ×1 IMPLANT
COVER TRANSDUCER ULTRASND (DRAPES) IMPLANT
DERMABOND ADVANCED .7 DNX12 (GAUZE/BANDAGES/DRESSINGS) ×1 IMPLANT
DRAPE C-ARM 42X120 X-RAY (DRAPES) IMPLANT
ELECT REM PT RETURN 15FT ADLT (MISCELLANEOUS) ×1 IMPLANT
GAUZE SPONGE 2X2 STRL 8-PLY (GAUZE/BANDAGES/DRESSINGS) ×1 IMPLANT
GLOVE BIO SURGEON STRL SZ7.5 (GLOVE) ×1 IMPLANT
GOWN STRL REUS W/ TWL XL LVL3 (GOWN DISPOSABLE) ×3 IMPLANT
GOWN STRL REUS W/TWL XL LVL3 (GOWN DISPOSABLE)
GRASPER SUT TROCAR 14GX15 (MISCELLANEOUS) IMPLANT
IRRIG SUCT STRYKERFLOW 2 WTIP (MISCELLANEOUS)
IRRIGATION SUCT STRKRFLW 2 WTP (MISCELLANEOUS) ×1 IMPLANT
KIT BASIN OR (CUSTOM PROCEDURE TRAY) ×1 IMPLANT
KIT TURNOVER KIT A (KITS) IMPLANT
NDL INSUFFLATION 14GA 120MM (NEEDLE) ×1 IMPLANT
NEEDLE INSUFFLATION 14GA 120MM (NEEDLE) IMPLANT
PENCIL SMOKE EVACUATOR (MISCELLANEOUS) IMPLANT
POUCH RETRIEVAL ECOSAC 10 (ENDOMECHANICALS) IMPLANT
POUCH RETRIEVAL ECOSAC 10MM (ENDOMECHANICALS)
SCISSORS LAP 5X35 DISP (ENDOMECHANICALS) ×1 IMPLANT
SET CHOLANGIOGRAPH MIX (MISCELLANEOUS) IMPLANT
SET TUBE SMOKE EVAC HIGH FLOW (TUBING) ×1 IMPLANT
SLEEVE Z-THREAD 5X100MM (TROCAR) IMPLANT
SPIKE FLUID TRANSFER (MISCELLANEOUS) ×1 IMPLANT
SUT MNCRL AB 4-0 PS2 18 (SUTURE) ×1 IMPLANT
TOWEL OR 17X26 10 PK STRL BLUE (TOWEL DISPOSABLE) ×1 IMPLANT
TRAY LAPAROSCOPIC (CUSTOM PROCEDURE TRAY) ×1 IMPLANT
TROCAR 11X100 Z THREAD (TROCAR) IMPLANT
TROCAR Z-THREAD OPTICAL 5X100M (TROCAR) ×1 IMPLANT

## 2022-07-30 NOTE — ED Provider Triage Note (Signed)
Emergency Medicine Provider Triage Evaluation Note  Pamela Wilkerson , a 52 y.o. female  was evaluated in triage.  Pt complains of abdominal pain, nausea and vomiting.  Symptoms started on Tuesday with persistent vomiting and right upper quadrant abdominal pain.  She had some associated nonbloody diarrhea.  She reports symptoms improved.  Yesterday but returned again this morning with persistent vomiting.  No history of abdominal surgeries  Review of Systems  Positive: Abdominal pain, vomiting, diarrhea Negative: Chest pain, fever  Physical Exam  BP (!) 174/112   Pulse 71   Temp 97.6 F (36.4 C) (Oral)   SpO2 100%  Gen:   Awake, active vomiting and appears uncomfortable Resp:  Normal effort  MSK:   Moves extremities without difficulty  Other:  Right upper quadrant tenderness without guarding  Medical Decision Making  Medically screening exam initiated at 10:43 AM.  Appropriate orders placed.  Pamela Wilkerson was informed that the remainder of the evaluation will be completed by another provider, this initial triage assessment does not replace that evaluation, and the importance of remaining in the ED until their evaluation is complete.  Labs and right upper quadrant ultrasound ordered, Zofran given in triage.   Jacqlyn Larsen, PA-C 07/30/22 1051

## 2022-07-30 NOTE — Anesthesia Preprocedure Evaluation (Deleted)
Anesthesia Evaluation    Reviewed: Allergy & Precautions, Patient's Chart, lab work & pertinent test results, Unable to perform ROS - Chart review only  Airway        Dental   Pulmonary neg pulmonary ROS          Cardiovascular negative cardio ROS      Neuro/Psych negative neurological ROS     GI/Hepatic negative GI ROS, Neg liver ROS,,,  Endo/Other    Renal/GU Renal InsufficiencyRenal disease     Musculoskeletal negative musculoskeletal ROS (+)    Abdominal  (+) + obese  Peds  Hematology negative hematology ROS (+)   Anesthesia Other Findings   Reproductive/Obstetrics                             Anesthesia Physical Anesthesia Plan  ASA: 2 and emergent  Anesthesia Plan: General   Post-op Pain Management:    Induction: Intravenous  PONV Risk Score and Plan: 4 or greater and Ondansetron, Dexamethasone, Treatment may vary due to age or medical condition and Midazolam  Airway Management Planned: Oral ETT  Additional Equipment:   Intra-op Plan:   Post-operative Plan: Extubation in OR  Informed Consent:   Plan Discussed with:   Anesthesia Plan Comments:        Anesthesia Quick Evaluation

## 2022-07-30 NOTE — ED Provider Notes (Signed)
Blood pressure (!) 174/88, pulse (!) 55, temperature 97.6 F (36.4 C), temperature source Oral, resp. rate 20, SpO2 98 %.  Assuming care from Dr. Matilde Sprang.  In short, Pamela Wilkerson is a 52 y.o. female with a chief complaint of Abdominal Pain .  Refer to the original H&P for additional details.  The current plan of care is to f/u on UA with ureteral stone.  05:34 PM  UA without clear infection but with ureteral stone will send culture.  With Dr. Abner Greenspan regarding kidney labs and patient's status.  Advised that she call the office for appointment tomorrow and they can follow. Patient's pain is well controlled and stable for d/c.    Margette Fast, MD 07/30/22 [phone]

## 2022-07-30 NOTE — ED Provider Notes (Signed)
[facility]-EMERGENCY DEPT Provider Note  CSN: [id number] Arrival date & time: 07/30/22 1022  Chief Complaint(s) Abdominal Pain  HPI Pamela Wilkerson is a 52 y.o. female with PMH asthma, depression who presents emergency department for evaluation of abdominal pain.  Patient endorses worsening right upper quadrant abdominal pain with associated nausea vomiting diarrhea for the last 48 hours.  States that over the last 24 hours the right upper quadrant cramps have significantly worsened and she arrives very uncomfortable.  Denies fever, headache, chest pain, shortness of breath or other systemic symptoms.   Past Medical History Past Medical History:  Diagnosis Date   Allergy    Asthma    Depression    H/O prior ablation treatment 11/21/2017   on uterus   Multiple food allergies    Patient Active Problem List   Diagnosis Date Noted   Frequent urination 01/20/2019   Allergic rhinitis 03/17/2018   Routine general medical examination at a health care facility 03/11/2018   Constipation 09/13/2015   Obesity 09/13/2015   Home Medication(s) Prior to Admission medications   Medication Sig Start Date End Date Taking? Authorizing Provider  albuterol (PROAIR HFA) 108 (90 Base) MCG/ACT inhaler INAHLE 2 PUFFS EVERY FOUR TO SIX HOURS AS NEEDED. 04/01/18   Myrlene Broker, MD  cetirizine (ZYRTEC) 10 MG tablet TAKE ONE TABLET BY MOUTH ONE TIME DAILY 03/08/15   Salley Scarlet, MD  clonazePAM (KLONOPIN) 0.5 MG tablet Take 0.5 mg by mouth daily as needed for anxiety.    [provider]  EPINEPHrine 0.3 mg/0.3 mL IJ SOAJ injection Inject 0.3 mLs (0.3 mg total) into the muscle once. 03/08/15   Archbold, Velna Hatchet, MD  Flaxseed, Linseed, (FLAX SEEDS PO) Take 2 tablets by mouth 3 (three) times daily.    [provider]  fluticasone (FLONASE) 50 MCG/ACT nasal spray Place 2 sprays into both nostrils daily. 03/17/18   Myrlene Broker, MD  lamoTRIgine (LAMICTAL) 100  MG tablet Take 100 mg by mouth daily.    [provider]  Magnesium 500 MG TABS Take 500 mg by mouth.    [provider]  Misc Natural Products (TART CHERRY ADVANCED PO) Take 2 tablets by mouth 3 (three) times daily.    [provider]  montelukast (SINGULAIR) 10 MG tablet Take 1 tablet (10 mg total) by mouth daily. DUE FOR ANNUAL VISIT FOR FURTHER REFILLS 06/30/19   Myrlene Broker, MD  traZODone (DESYREL) 100 MG tablet Take 100 mg by mouth at bedtime.    [provider]  triamcinolone (NASACORT ALLERGY 24HR) 55 MCG/ACT AERO nasal inhaler Place 2 sprays into the nose daily. 03/08/15   Salley Scarlet, MD                                                                                                                                    Past Surgical History History reviewed. No pertinent  surgical history. Family History Family History  Problem Relation Age of Onset   Diabetes Maternal Uncle    Mental retardation Father    Hyperlipidemia Father    Diabetes Maternal Aunt    Cancer Maternal Aunt 57       stage 4 brain cancer.   Arthritis Maternal Grandmother    Stroke Maternal Grandmother    Diabetes Maternal Grandmother    Hyperlipidemia Paternal Grandmother    Heart disease Paternal Grandmother     Social History Social History   Tobacco Use   Smoking status: Never   Smokeless tobacco: Never  Substance Use Topics   Alcohol use: No   Drug use: No   Allergies Amoxicillin, Aspartame and phenylalanine, Food color red [red dye], Lactose intolerance (gi), Pineapple, Pyridium [phenazopyridine hcl], Strawberry (diagnostic), and Penicillins  Review of Systems Review of Systems  Gastrointestinal:  Positive for abdominal pain, diarrhea, nausea and vomiting.    Physical Exam Vital Signs  I have reviewed the triage vital signs BP (!) 174/112   Pulse 71   Temp 97.6 F (36.4 C) (Oral)   Resp 20   SpO2 100%   Physical Exam Vitals and  nursing note reviewed.  Constitutional:      General: She is not in acute distress.    Appearance: She is well-developed. She is ill-appearing.  HENT:     Head: Normocephalic and atraumatic.  Eyes:     Conjunctiva/sclera: Conjunctivae normal.  Cardiovascular:     Rate and Rhythm: Normal rate and regular rhythm.     Heart sounds: No murmur heard. Pulmonary:     Effort: Pulmonary effort is normal. No respiratory distress.     Breath sounds: Normal breath sounds.  Abdominal:     Palpations: Abdomen is soft.     Tenderness: There is abdominal tenderness in the right upper quadrant.  Musculoskeletal:        General: No swelling.     Cervical back: Neck supple.  Skin:    General: Skin is warm and dry.     Capillary Refill: Capillary refill takes less than 2 seconds.  Neurological:     Mental Status: She is alert.  Psychiatric:        Mood and Affect: Mood normal.     ED Results and Treatments Labs (all labs ordered are listed, but only abnormal results are displayed) Labs Reviewed  COMPREHENSIVE METABOLIC PANEL - Abnormal; Notable for the following components:      Result Value   Glucose, Bld 121 (*)    Creatinine, Ser 1.61 (*)    GFR, Estimated 39 (*)    All other components within normal limits  LIPASE, BLOOD - Abnormal; Notable for the following components:   Lipase 63 (*)    All other components within normal limits  CBC WITH DIFFERENTIAL/PLATELET - Abnormal; Notable for the following components:   WBC 12.2 (*)    RBC 5.50 (*)    Neutro Abs 9.8 (*)    All other components within normal limits  URINALYSIS, ROUTINE W REFLEX MICROSCOPIC  I-STAT BETA HCG BLOOD, ED ([facility], [facility], [facility] ONLY)  Radiology US Abdomen Limited RUQ (LIVER/GB)  Result Date: 07/30/2022 CLINICAL DATA:  Two day history of right upper quadrant pain. EXAM: ULTRASOUND ABDOMEN LIMITED RIGHT  UPPER QUADRANT COMPARISON:  None Available. FINDINGS: Gallbladder: Stone within the gallbladder measures 7 mm. Gallbladder wall is mildly thickened at 3 mm. No pericholecystic fluid or gallbladder sludge. Positive sonographic Murphy's sign. Common bile duct: Diameter: 2.1 mm Liver: No focal lesion identified. Increased parenchymal echogenicity of the liver suggest hepatic steatosis. Portal vein is patent on color Doppler imaging with normal direction of blood flow towards the liver. Other: None. IMPRESSION: 1. Gallstone with mild gallbladder wall thickening and positive sonographic Murphy's sign. Findings are suspicious for acute cholecystitis. If further imaging is clinically indicated consider nuclear medicine hepatic biliary scan. 2. Hepatic steatosis. Electronically Signed   By: Signa Kell M.D.   On: 07/30/2022 11:41    Pertinent labs & imaging results that were available during my care of the patient were reviewed by me and considered in my medical decision making (see MDM for details).  Medications Ordered in ED Medications  cefTRIAXone (ROCEPHIN) 1 g in sodium chloride 0.9 % 100 mL IVPB (has no administration in time range)  metroNIDAZOLE (FLAGYL) IVPB 500 mg (has no administration in time range)  lactated ringers bolus 1,000 mL (has no administration in time range)  ondansetron (ZOFRAN-ODT) disintegrating tablet 4 mg (4 mg Oral Given 07/30/22 1048)  oxyCODONE-acetaminophen (PERCOCET/ROXICET) 5-325 MG per tablet 1 tablet (1 tablet Oral Given 07/30/22 1132)                                                                                                                                     Procedures Procedures  (including critical care time)  Medical Decision Making / ED Course   This patient presents to the ED for concern of abdominal pain, this involves an extensive number of treatment options, and is a complaint that carries with it a high risk of complications and morbidity.  The  differential diagnosis includes cholecystitis, cholelithiasis/biliary colic, choledocholithiasis, ascending cholangitis, acute hepatitis, pancreatitis, GERD, pneumonia, constipation, nephrolithiasis  MDM: Patient seen the emergency room for evaluation of abdominal pain.  Physical exam with right upper quadrant tenderness to palpation and the patient is overall ill-appearing.  Laboratory evaluation with a new leukocytosis to 12.2 but LFTs and total bili unremarkable.  Creatinine 1.6 likely secondary to prerenal AKI.  Right upper quadrant ultrasound concerning for acute cholecystitis and I consulted the general surgeon Dr. Derrell Lolling, and the surgical APP Barnetta Chapel came and evaluated the patient at bedside.  Based on surgical physical exam, surgery requested a follow-up CT scan as the patient's symptoms do not entirely fit with acute cholecystitis at this time and may be appendicitis versus nephrolithiasis.  CT abdomen pelvis was obtained that does show a 4 x 4 x 6 mm obstructing stone in the area of the right upper quadrant and given patient's colicky  symptoms with normal LFTs and total bili, this may be much better explanation for the patient's pain.  At this time, surgery recommending urology consultation and urinalysis and will not take the patient to surgery this evening unless significant change in the patient's clinical status.  Toradol was given and pain significantly improved.  At time of signout, patient pending urinalysis to rule out septic stone in setting of her creatinine elevation and white count.  Patient hemodynamically stable on my reevaluation and please see provider signout for continuation of workup.   Additional history obtained: -Additional history obtained from mother -External records from outside source obtained and reviewed including: Chart review including previous notes, labs, imaging, consultation notes   Lab Tests: -I ordered, reviewed, and interpreted labs.   The pertinent  results include:   Labs Reviewed  COMPREHENSIVE METABOLIC PANEL - Abnormal; Notable for the following components:      Result Value   Glucose, Bld 121 (*)    Creatinine, Ser 1.61 (*)    GFR, Estimated 39 (*)    All other components within normal limits  LIPASE, BLOOD - Abnormal; Notable for the following components:   Lipase 63 (*)    All other components within normal limits  CBC WITH DIFFERENTIAL/PLATELET - Abnormal; Notable for the following components:   WBC 12.2 (*)    RBC 5.50 (*)    Neutro Abs 9.8 (*)    All other components within normal limits  URINALYSIS, ROUTINE W REFLEX MICROSCOPIC  I-STAT BETA HCG BLOOD, ED ([facility], [facility], [facility] ONLY)    Imaging Studies ordered: I ordered imaging studies including CTAP, right upper quadrant ultrasound I independently visualized and interpreted imaging. I agree with the radiologist interpretation   Medicines ordered and prescription drug management: Meds ordered this encounter  Medications   ondansetron (ZOFRAN-ODT) disintegrating tablet 4 mg   oxyCODONE-acetaminophen (PERCOCET/ROXICET) 5-325 MG per tablet 1 tablet   cefTRIAXone (ROCEPHIN) 1 g in sodium chloride 0.9 % 100 mL IVPB    Order Specific Question:   Antibiotic Indication:    Answer:   Intra-abdominal   metroNIDAZOLE (FLAGYL) IVPB 500 mg    Order Specific Question:   Antibiotic Indication:    Answer:   Intra-abdominal Infection   lactated ringers bolus 1,000 mL    -I have reviewed the patients home medicines and have made adjustments as needed  Critical interventions none  Consultations Obtained: I requested consultation with the general surgery team,  and discussed lab and imaging findings as well as pertinent plan - they recommend: CT abdomen pelvis, urology consultation   Cardiac Monitoring: The patient was maintained on a cardiac monitor.  I personally viewed and interpreted the cardiac monitored which showed an underlying rhythm of: NSR  Social Determinants of  Health:  Factors impacting patients care include: none   Reevaluation: After the interventions noted above, I reevaluated the patient and found that they have :improved  Co morbidities that complicate the patient evaluation  Past Medical History:  Diagnosis Date   Allergy    Asthma    Depression    H/O prior ablation treatment 11/21/2017   on uterus   Multiple food allergies       Dispostion: I considered admission for this patient, and disposition pending urinalysis.  Please see provider signout for continuation of workup     Final Clinical Impression(s) / ED Diagnoses Final diagnoses:  None     @PCDICTATION @    Teressa Lower, MD 07/30/22 [phone]

## 2022-07-30 NOTE — ED Triage Notes (Signed)
Pt c/o RUQ abdominal pain ongoing since Tuesday. Pt with frequent N/V since.

## 2022-07-30 NOTE — Discharge Instructions (Addendum)

## 2022-07-30 NOTE — Consult Note (Signed)
Pamela Wilkerson 02/09/1971  [id number].    Requesting MD: Dr. Glendora Score Chief Complaint/Reason for Consult: Right-sided abdominal pain, cholelithiasis  HPI:  52 y.o. female with medical history significant for asthma, depression who presented to [facility] ED with right-sided abdominal pain. She started having pain on Tuesday night along with N/V/diarrhea.  Her pain improved some on Wednesday, but was unable to eat or drink due to persistent N/dry heaving.  Her pain came back again last night into today and severe.  She does not associated her pain with food.  This did not start in her central abdomen and migrate.  It did not hurt in her epigastrium, just all "along the right side."  She denies pain in her back.  She denies dysuria or hematuria.  She thinks she may have had something 2 years ago similar to this, but she can't fully remember.  She denies having issues with food intolerance normally causing abdominal pain.  She denies any fevers.   She presented to the ED where she was found to have cholelithiasis with possible Murphy's sign on [place], WBC of 12K, normal LFTs, Cr of 1.61.  we were asked to see her for possible cholecystitis.     Substance use: none Allergies: PCN - rash Blood thinners: none Past Surgeries: lipoma removal  ROS: ROS: see HPI  Family History  Problem Relation Age of Onset   Diabetes Maternal Uncle    Mental retardation Father    Hyperlipidemia Father    Diabetes Maternal Aunt    Cancer Maternal Aunt 28       stage 4 brain cancer.   Arthritis Maternal Grandmother    Stroke Maternal Grandmother    Diabetes Maternal Grandmother    Hyperlipidemia Paternal Grandmother    Heart disease Paternal Grandmother     Past Medical History:  Diagnosis Date   Allergy    Asthma    Depression    H/O prior ablation treatment 11/21/2017   on uterus   Multiple food allergies     History reviewed. No pertinent surgical history.  Social History:  reports that  she has never smoked. She has never used smokeless tobacco. She reports that she does not drink alcohol and does not use drugs.  Allergies:  Allergies  Allergen Reactions   Amoxicillin    Aspartame And Phenylalanine Itching   Food Color Red [Red Dye] Itching    and yellow dyes.   Lactose Intolerance (Gi)    Pineapple    Pyridium [Phenazopyridine Hcl] Hives   Strawberry (Diagnostic) Itching    Neck itching   Penicillins Itching and Rash    (Not in a hospital admission)    Physical Exam: Blood pressure (!) 174/112, pulse 71, temperature 97.6 F (36.4 C), temperature source Oral, resp. rate 20, SpO2 100 %. General: pleasant, mildly obese black female who is laying in bed in NAD right now secondary to pain medication HEENT: head is normocephalic, atraumatic.  Sclera are noninjected.  PERRL.  Ears and nose without any masses or lesions.  Mouth is pink and moist Heart: regular, rate, and rhythm.  Normal s1,s2. No obvious murmurs, gallops, or rubs noted.  Palpable radial and pedal pulses bilaterally Lungs: CTAB, no wheezes, rhonchi, or rales noted.  Respiratory effort nonlabored Abd: soft, mildly tender secondary to pain medication but all along the right side of her abdomen, in upper as well as lower equally, ND, +BS, no masses, hernias, or organomegaly MS: all 4 extremities are symmetrical  with no cyanosis, clubbing, or edema. Skin: warm and dry with no masses, lesions, or rashes Neuro: Cranial nerves 2-12 grossly intact, sensation is normal throughout Psych: A&Ox3 with an appropriate affect.   Results for orders placed or performed during the hospital encounter of 07/30/22 (from the past 48 hour(s))  Comprehensive metabolic panel     Status: Abnormal   Collection Time: 07/30/22 11:27 AM  Result Value Ref Range   Sodium 135 135 - 145 mmol/L   Potassium 3.8 3.5 - 5.1 mmol/L   Chloride 101 98 - 111 mmol/L   CO2 25 22 - 32 mmol/L   Glucose, Bld 121 (H) 70 - 99 mg/dL    Comment:  Glucose reference range applies only to samples taken after fasting for at least 8 hours.   BUN 20 6 - 20 mg/dL   Creatinine, Ser 1.61 (H) 0.44 - 1.00 mg/dL   Calcium 8.9 8.9 - 10.3 mg/dL   Total Protein 7.6 6.5 - 8.1 g/dL   Albumin 3.8 3.5 - 5.0 g/dL   AST 21 15 - 41 U/L   ALT 16 0 - 44 U/L   Alkaline Phosphatase 43 38 - 126 U/L   Total Bilirubin 0.5 0.3 - 1.2 mg/dL   GFR, Estimated 39 (L) >60 mL/min    Comment: (NOTE) Calculated using the CKD-EPI Creatinine Equation (2021)    Anion gap 9 5 - 15    Comment: Performed at [facility], [place] [address], [place], [place] [address]  Lipase, blood     Status: Abnormal   Collection Time: 07/30/22 11:27 AM  Result Value Ref Range   Lipase 63 (H) 11 - 51 U/L    Comment: Performed at [facility], [place] [address], [place], [place] [address]  CBC with Differential     Status: Abnormal   Collection Time: 07/30/22 11:27 AM  Result Value Ref Range   WBC 12.2 (H) 4.0 - 10.5 K/uL   RBC 5.50 (H) 3.87 - 5.11 MIL/uL   Hemoglobin 14.4 12.0 - 15.0 g/dL   HCT 45.7 36.0 - 46.0 %   MCV 83.1 80.0 - 100.0 fL   MCH 26.2 26.0 - 34.0 pg   MCHC 31.5 30.0 - 36.0 g/dL   RDW 13.6 11.5 - 15.5 %   Platelets 296 150 - 400 K/uL   nRBC 0.0 0.0 - 0.2 %   Neutrophils Relative % 81 %   Neutro Abs 9.8 (H) 1.7 - 7.7 K/uL   Lymphocytes Relative 11 %   Lymphs Abs 1.4 0.7 - 4.0 K/uL   Monocytes Relative 8 %   Monocytes Absolute 1.0 0.1 - 1.0 K/uL   Eosinophils Relative 0 %   Eosinophils Absolute 0.0 0.0 - 0.5 K/uL   Basophils Relative 0 %   Basophils Absolute 0.0 0.0 - 0.1 K/uL   Immature Granulocytes 0 %   Abs Immature Granulocytes 0.05 0.00 - 0.07 K/uL    Comment: Performed at [facility], [place] [address] [place], [place] [address]  I-Stat beta hCG blood, ED     Status: None   Collection Time: 07/30/22 11:33 AM  Result Value Ref Range   I-stat hCG, quantitative <5.0 <5 mIU/mL   Comment 3             Comment:   GEST. AGE      CONC.  (mIU/mL)   <=1 WEEK        5 - 50     2 WEEKS  50 - 500     3 WEEKS       100 - 10,000     4 WEEKS     1,000 - 30,000        FEMALE AND NON-PREGNANT FEMALE:     LESS THAN 5 mIU/mL    CT ABDOMEN PELVIS WO CONTRAST  Result Date: 07/30/2022 CLINICAL DATA:  Right lower quadrant abdominal pain. Nausea and vomiting. EXAM: CT ABDOMEN AND PELVIS WITHOUT CONTRAST TECHNIQUE: Multidetector CT imaging of the abdomen and pelvis was performed following the standard protocol without IV contrast. RADIATION DOSE REDUCTION: This exam was performed according to the departmental dose-optimization program which includes automated exposure control, adjustment of the mA and/or kV according to patient size and/or use of iterative reconstruction technique. COMPARISON:  None Available. FINDINGS: Lower chest: Unremarkable. Hepatobiliary: 8 mm hypoattenuating lesion posterior aspect of the inferior right liver is too small to characterize but likely benign. No followup imaging is recommended. Gallstones seen on ultrasound exam earlier today not readily evident on CT. Gallbladder wall is mildly ill-defined. Trace pericholecystic edema suspected. No intrahepatic or extrahepatic biliary dilation. Pancreas: No focal mass lesion. No dilatation of the main duct. No intraparenchymal cyst. No peripancreatic edema. Spleen: No splenomegaly. No focal mass lesion. Adrenals/Urinary Tract: No adrenal nodule or mass. Left kidney and ureter unremarkable. Mild right hydronephrosis evident with 5 mm nonobstructing stone interpolar right kidney. Right-sided urinary obstruction is secondary to the presence of a 4 x 4 x 6 mm stone at or just distal to the right UPJ. Right ureter otherwise unremarkable. The urinary bladder appears normal for the degree of distention. Stomach/Bowel: Stomach is unremarkable. No gastric wall thickening. No evidence of outlet obstruction. Duodenum is normally positioned as is the ligament  of Treitz. No small bowel wall thickening. No small bowel dilatation. The terminal ileum is normal. The appendix is normal. No gross colonic mass. No colonic wall thickening. Vascular/Lymphatic: No abdominal aortic aneurysm. No abdominal aortic atherosclerotic calcification. There is no gastrohepatic or hepatoduodenal ligament lymphadenopathy. No retroperitoneal or mesenteric lymphadenopathy. No pelvic sidewall lymphadenopathy. Reproductive: Uterine fibroids evident.  There is no adnexal mass. Other: No intraperitoneal free fluid. Musculoskeletal: No worrisome lytic or sclerotic osseous abnormality. IMPRESSION: 1. 4 x 4 x 6 mm stone at or just distal to the right UPJ with mild right hydronephrosis. 2. 5 mm nonobstructing stone interpolar right kidney. 3. Gallbladder wall is mildly ill-defined. Trace pericholecystic edema suspected. Gallstones seen on ultrasound exam earlier today not readily evident on CT. Imaging appearance taken in the context of the ultrasound exam raises the question of acute cholecystitis. If there is clinical concern for cystic duct obstruction, nuclear scintigraphy may prove helpful. 4. Uterine fibroids. Electronically Signed   By: Misty Stanley M.D.   On: 07/30/2022 13:54   US Abdomen Limited RUQ (LIVER/GB)  Result Date: 07/30/2022 CLINICAL DATA:  Two day history of right upper quadrant pain. EXAM: ULTRASOUND ABDOMEN LIMITED RIGHT UPPER QUADRANT COMPARISON:  None Available. FINDINGS: Gallbladder: Stone within the gallbladder measures 7 mm. Gallbladder wall is mildly thickened at 3 mm. No pericholecystic fluid or gallbladder sludge. Positive sonographic Murphy's sign. Common bile duct: Diameter: 2.1 mm Liver: No focal lesion identified. Increased parenchymal echogenicity of the liver suggest hepatic steatosis. Portal vein is patent on color Doppler imaging with normal direction of blood flow towards the liver. Other: None. IMPRESSION: 1. Gallstone with mild gallbladder wall thickening and  positive sonographic Murphy's sign. Findings are suspicious for acute cholecystitis. If further imaging is  clinically indicated consider nuclear medicine hepatic biliary scan. 2. Hepatic steatosis. Electronically Signed   By: Signa Kell M.D.   On: 07/30/2022 11:41      Assessment/Plan Obstructing right ureteral nephrolithiasis with mild hydronephrosis, mild AKI   I have seen the patient, reviewed the chart, labs, vitals, and imaging.  Upon my initial evaluation, the patient had some pain in her RUQ as well as RLQ and everywhere in between.  She did not have classic biliary or appendix stories per se, but N/V/D and right-sided pain.  Given her US revealed gallstones, but only a 19mm thick gallbladder wall which is essentially normal with no stranding or pericholecystic fluid, normal LFTs, etc. The recommendation was made for a CT scan to rule out appendicitis or some other etiology.  This was completed and the patient was found to have a normal appendix and essentially a normal gallbladder.  She was found to have an obstructing right ureteral stone causing some mild hydronephrosis.  A UA has not been completed yet.  This was all discussed with the EDP.  No plans for surgical intervention at this time.  Will defer further work up of this problem to EDP/likely urology at this time.  I will give her our office information so if she does have biliary colic moving forward she can call us for evaluation for cholecystectomy.   I reviewed ED provider notes, last 24 h vitals and pain scores, last 48 h intake and output, last 24 h labs and trends, and last 24 h imaging results.  Letha Cape, [facility] Surgery 07/30/2022, 1:58 PM Please see Amion for pager number during day hours 7:00am-4:30pm or 7:00am -11:30am on weekends

## 2022-08-03 DIAGNOSIS — N390 Urinary tract infection, site not specified: Secondary | ICD-10-CM | POA: Diagnosis not present

## 2022-08-03 DIAGNOSIS — N132 Hydronephrosis with renal and ureteral calculous obstruction: Secondary | ICD-10-CM | POA: Diagnosis not present

## 2022-08-03 DIAGNOSIS — B964 Proteus (mirabilis) (morganii) as the cause of diseases classified elsewhere: Secondary | ICD-10-CM | POA: Diagnosis not present

## 2022-08-05 DIAGNOSIS — J3089 Other allergic rhinitis: Secondary | ICD-10-CM | POA: Diagnosis not present

## 2022-08-05 DIAGNOSIS — J301 Allergic rhinitis due to pollen: Secondary | ICD-10-CM | POA: Diagnosis not present

## 2022-08-05 DIAGNOSIS — N200 Calculus of kidney: Secondary | ICD-10-CM | POA: Diagnosis not present

## 2022-08-12 DIAGNOSIS — J301 Allergic rhinitis due to pollen: Secondary | ICD-10-CM | POA: Diagnosis not present

## 2022-08-12 DIAGNOSIS — J3089 Other allergic rhinitis: Secondary | ICD-10-CM | POA: Diagnosis not present

## 2022-08-12 DIAGNOSIS — J3081 Allergic rhinitis due to animal (cat) (dog) hair and dander: Secondary | ICD-10-CM | POA: Diagnosis not present

## 2022-08-19 DIAGNOSIS — J3089 Other allergic rhinitis: Secondary | ICD-10-CM | POA: Diagnosis not present

## 2022-08-19 DIAGNOSIS — J301 Allergic rhinitis due to pollen: Secondary | ICD-10-CM | POA: Diagnosis not present

## 2022-08-19 DIAGNOSIS — J3081 Allergic rhinitis due to animal (cat) (dog) hair and dander: Secondary | ICD-10-CM | POA: Diagnosis not present

## 2022-08-27 DIAGNOSIS — J3081 Allergic rhinitis due to animal (cat) (dog) hair and dander: Secondary | ICD-10-CM | POA: Diagnosis not present

## 2022-08-27 DIAGNOSIS — J3089 Other allergic rhinitis: Secondary | ICD-10-CM | POA: Diagnosis not present

## 2022-08-27 DIAGNOSIS — J301 Allergic rhinitis due to pollen: Secondary | ICD-10-CM | POA: Diagnosis not present

## 2022-09-01 DIAGNOSIS — J3081 Allergic rhinitis due to animal (cat) (dog) hair and dander: Secondary | ICD-10-CM | POA: Diagnosis not present

## 2022-09-01 DIAGNOSIS — J301 Allergic rhinitis due to pollen: Secondary | ICD-10-CM | POA: Diagnosis not present

## 2022-09-01 DIAGNOSIS — J3089 Other allergic rhinitis: Secondary | ICD-10-CM | POA: Diagnosis not present

## 2022-09-09 DIAGNOSIS — J301 Allergic rhinitis due to pollen: Secondary | ICD-10-CM | POA: Diagnosis not present

## 2022-09-09 DIAGNOSIS — J3089 Other allergic rhinitis: Secondary | ICD-10-CM | POA: Diagnosis not present

## 2022-09-16 DIAGNOSIS — J301 Allergic rhinitis due to pollen: Secondary | ICD-10-CM | POA: Diagnosis not present

## 2022-09-16 DIAGNOSIS — J3089 Other allergic rhinitis: Secondary | ICD-10-CM | POA: Diagnosis not present

## 2022-09-16 DIAGNOSIS — J3081 Allergic rhinitis due to animal (cat) (dog) hair and dander: Secondary | ICD-10-CM | POA: Diagnosis not present

## 2022-09-23 DIAGNOSIS — J301 Allergic rhinitis due to pollen: Secondary | ICD-10-CM | POA: Diagnosis not present

## 2022-09-23 DIAGNOSIS — J3081 Allergic rhinitis due to animal (cat) (dog) hair and dander: Secondary | ICD-10-CM | POA: Diagnosis not present

## 2022-09-23 DIAGNOSIS — J3089 Other allergic rhinitis: Secondary | ICD-10-CM | POA: Diagnosis not present

## 2022-09-30 DIAGNOSIS — J301 Allergic rhinitis due to pollen: Secondary | ICD-10-CM | POA: Diagnosis not present

## 2022-09-30 DIAGNOSIS — J3089 Other allergic rhinitis: Secondary | ICD-10-CM | POA: Diagnosis not present

## 2022-10-07 DIAGNOSIS — J3089 Other allergic rhinitis: Secondary | ICD-10-CM | POA: Diagnosis not present

## 2022-10-07 DIAGNOSIS — J3081 Allergic rhinitis due to animal (cat) (dog) hair and dander: Secondary | ICD-10-CM | POA: Diagnosis not present

## 2022-10-07 DIAGNOSIS — J301 Allergic rhinitis due to pollen: Secondary | ICD-10-CM | POA: Diagnosis not present

## 2022-10-14 DIAGNOSIS — J3089 Other allergic rhinitis: Secondary | ICD-10-CM | POA: Diagnosis not present

## 2022-10-14 DIAGNOSIS — J3081 Allergic rhinitis due to animal (cat) (dog) hair and dander: Secondary | ICD-10-CM | POA: Diagnosis not present

## 2022-10-14 DIAGNOSIS — J301 Allergic rhinitis due to pollen: Secondary | ICD-10-CM | POA: Diagnosis not present

## 2022-10-26 DIAGNOSIS — J3081 Allergic rhinitis due to animal (cat) (dog) hair and dander: Secondary | ICD-10-CM | POA: Diagnosis not present

## 2022-10-26 DIAGNOSIS — J301 Allergic rhinitis due to pollen: Secondary | ICD-10-CM | POA: Diagnosis not present

## 2022-10-26 DIAGNOSIS — J3089 Other allergic rhinitis: Secondary | ICD-10-CM | POA: Diagnosis not present

## 2022-11-03 DIAGNOSIS — J3089 Other allergic rhinitis: Secondary | ICD-10-CM | POA: Diagnosis not present

## 2022-11-03 DIAGNOSIS — J301 Allergic rhinitis due to pollen: Secondary | ICD-10-CM | POA: Diagnosis not present

## 2022-11-11 DIAGNOSIS — J301 Allergic rhinitis due to pollen: Secondary | ICD-10-CM | POA: Diagnosis not present

## 2022-11-12 DIAGNOSIS — J3081 Allergic rhinitis due to animal (cat) (dog) hair and dander: Secondary | ICD-10-CM | POA: Diagnosis not present

## 2022-11-12 DIAGNOSIS — J301 Allergic rhinitis due to pollen: Secondary | ICD-10-CM | POA: Diagnosis not present

## 2022-11-12 DIAGNOSIS — J3089 Other allergic rhinitis: Secondary | ICD-10-CM | POA: Diagnosis not present

## 2022-11-18 DIAGNOSIS — J3089 Other allergic rhinitis: Secondary | ICD-10-CM | POA: Diagnosis not present

## 2022-11-18 DIAGNOSIS — J3081 Allergic rhinitis due to animal (cat) (dog) hair and dander: Secondary | ICD-10-CM | POA: Diagnosis not present

## 2022-11-18 DIAGNOSIS — J301 Allergic rhinitis due to pollen: Secondary | ICD-10-CM | POA: Diagnosis not present

## 2022-11-27 DIAGNOSIS — J3089 Other allergic rhinitis: Secondary | ICD-10-CM | POA: Diagnosis not present

## 2022-11-27 DIAGNOSIS — J3081 Allergic rhinitis due to animal (cat) (dog) hair and dander: Secondary | ICD-10-CM | POA: Diagnosis not present

## 2022-11-27 DIAGNOSIS — J301 Allergic rhinitis due to pollen: Secondary | ICD-10-CM | POA: Diagnosis not present

## 2022-12-02 DIAGNOSIS — J301 Allergic rhinitis due to pollen: Secondary | ICD-10-CM | POA: Diagnosis not present

## 2022-12-02 DIAGNOSIS — J3081 Allergic rhinitis due to animal (cat) (dog) hair and dander: Secondary | ICD-10-CM | POA: Diagnosis not present

## 2022-12-02 DIAGNOSIS — J3089 Other allergic rhinitis: Secondary | ICD-10-CM | POA: Diagnosis not present

## 2022-12-14 DIAGNOSIS — J3081 Allergic rhinitis due to animal (cat) (dog) hair and dander: Secondary | ICD-10-CM | POA: Diagnosis not present

## 2022-12-14 DIAGNOSIS — J3089 Other allergic rhinitis: Secondary | ICD-10-CM | POA: Diagnosis not present

## 2022-12-14 DIAGNOSIS — J301 Allergic rhinitis due to pollen: Secondary | ICD-10-CM | POA: Diagnosis not present

## 2022-12-30 DIAGNOSIS — F413 Other mixed anxiety disorders: Secondary | ICD-10-CM | POA: Diagnosis not present

## 2022-12-30 DIAGNOSIS — J3081 Allergic rhinitis due to animal (cat) (dog) hair and dander: Secondary | ICD-10-CM | POA: Diagnosis not present

## 2022-12-30 DIAGNOSIS — J3089 Other allergic rhinitis: Secondary | ICD-10-CM | POA: Diagnosis not present

## 2022-12-30 DIAGNOSIS — F332 Major depressive disorder, recurrent severe without psychotic features: Secondary | ICD-10-CM | POA: Diagnosis not present

## 2022-12-30 DIAGNOSIS — Z01419 Encounter for gynecological examination (general) (routine) without abnormal findings: Secondary | ICD-10-CM | POA: Diagnosis not present

## 2022-12-30 DIAGNOSIS — Z6837 Body mass index (BMI) 37.0-37.9, adult: Secondary | ICD-10-CM | POA: Diagnosis not present

## 2022-12-30 DIAGNOSIS — Z124 Encounter for screening for malignant neoplasm of cervix: Secondary | ICD-10-CM | POA: Diagnosis not present

## 2022-12-30 DIAGNOSIS — Z1231 Encounter for screening mammogram for malignant neoplasm of breast: Secondary | ICD-10-CM | POA: Diagnosis not present

## 2022-12-30 DIAGNOSIS — J301 Allergic rhinitis due to pollen: Secondary | ICD-10-CM | POA: Diagnosis not present

## 2023-01-06 DIAGNOSIS — J3081 Allergic rhinitis due to animal (cat) (dog) hair and dander: Secondary | ICD-10-CM | POA: Diagnosis not present

## 2023-01-06 DIAGNOSIS — J301 Allergic rhinitis due to pollen: Secondary | ICD-10-CM | POA: Diagnosis not present

## 2023-01-06 DIAGNOSIS — J3089 Other allergic rhinitis: Secondary | ICD-10-CM | POA: Diagnosis not present

## 2023-01-12 DIAGNOSIS — J301 Allergic rhinitis due to pollen: Secondary | ICD-10-CM | POA: Diagnosis not present

## 2023-01-12 DIAGNOSIS — J3089 Other allergic rhinitis: Secondary | ICD-10-CM | POA: Diagnosis not present

## 2023-01-12 DIAGNOSIS — J3081 Allergic rhinitis due to animal (cat) (dog) hair and dander: Secondary | ICD-10-CM | POA: Diagnosis not present

## 2023-01-19 DIAGNOSIS — J301 Allergic rhinitis due to pollen: Secondary | ICD-10-CM | POA: Diagnosis not present

## 2023-01-19 DIAGNOSIS — J3089 Other allergic rhinitis: Secondary | ICD-10-CM | POA: Diagnosis not present

## 2023-01-19 DIAGNOSIS — J3081 Allergic rhinitis due to animal (cat) (dog) hair and dander: Secondary | ICD-10-CM | POA: Diagnosis not present

## 2023-01-26 DIAGNOSIS — L508 Other urticaria: Secondary | ICD-10-CM | POA: Diagnosis not present

## 2023-02-09 DIAGNOSIS — L282 Other prurigo: Secondary | ICD-10-CM | POA: Diagnosis not present

## 2023-02-24 DIAGNOSIS — J3081 Allergic rhinitis due to animal (cat) (dog) hair and dander: Secondary | ICD-10-CM | POA: Diagnosis not present

## 2023-02-24 DIAGNOSIS — J3089 Other allergic rhinitis: Secondary | ICD-10-CM | POA: Diagnosis not present

## 2023-02-24 DIAGNOSIS — J301 Allergic rhinitis due to pollen: Secondary | ICD-10-CM | POA: Diagnosis not present

## 2023-03-03 DIAGNOSIS — J3089 Other allergic rhinitis: Secondary | ICD-10-CM | POA: Diagnosis not present

## 2023-03-03 DIAGNOSIS — J3081 Allergic rhinitis due to animal (cat) (dog) hair and dander: Secondary | ICD-10-CM | POA: Diagnosis not present

## 2023-03-03 DIAGNOSIS — J301 Allergic rhinitis due to pollen: Secondary | ICD-10-CM | POA: Diagnosis not present

## 2023-03-08 DIAGNOSIS — H1045 Other chronic allergic conjunctivitis: Secondary | ICD-10-CM | POA: Diagnosis not present

## 2023-03-08 DIAGNOSIS — J3081 Allergic rhinitis due to animal (cat) (dog) hair and dander: Secondary | ICD-10-CM | POA: Diagnosis not present

## 2023-03-08 DIAGNOSIS — R062 Wheezing: Secondary | ICD-10-CM | POA: Diagnosis not present

## 2023-03-08 DIAGNOSIS — J3089 Other allergic rhinitis: Secondary | ICD-10-CM | POA: Diagnosis not present

## 2023-03-08 DIAGNOSIS — J301 Allergic rhinitis due to pollen: Secondary | ICD-10-CM | POA: Diagnosis not present

## 2023-03-17 DIAGNOSIS — F332 Major depressive disorder, recurrent severe without psychotic features: Secondary | ICD-10-CM | POA: Diagnosis not present

## 2023-03-17 DIAGNOSIS — F413 Other mixed anxiety disorders: Secondary | ICD-10-CM | POA: Diagnosis not present

## 2023-03-19 DIAGNOSIS — J3081 Allergic rhinitis due to animal (cat) (dog) hair and dander: Secondary | ICD-10-CM | POA: Diagnosis not present

## 2023-03-19 DIAGNOSIS — J301 Allergic rhinitis due to pollen: Secondary | ICD-10-CM | POA: Diagnosis not present

## 2023-03-19 DIAGNOSIS — J3089 Other allergic rhinitis: Secondary | ICD-10-CM | POA: Diagnosis not present

## 2023-03-24 DIAGNOSIS — J301 Allergic rhinitis due to pollen: Secondary | ICD-10-CM | POA: Diagnosis not present

## 2023-03-24 DIAGNOSIS — J3089 Other allergic rhinitis: Secondary | ICD-10-CM | POA: Diagnosis not present

## 2023-03-31 DIAGNOSIS — J3089 Other allergic rhinitis: Secondary | ICD-10-CM | POA: Diagnosis not present

## 2023-03-31 DIAGNOSIS — J301 Allergic rhinitis due to pollen: Secondary | ICD-10-CM | POA: Diagnosis not present

## 2023-04-07 DIAGNOSIS — J3081 Allergic rhinitis due to animal (cat) (dog) hair and dander: Secondary | ICD-10-CM | POA: Diagnosis not present

## 2023-04-07 DIAGNOSIS — J3089 Other allergic rhinitis: Secondary | ICD-10-CM | POA: Diagnosis not present

## 2023-04-07 DIAGNOSIS — J301 Allergic rhinitis due to pollen: Secondary | ICD-10-CM | POA: Diagnosis not present

## 2023-04-14 DIAGNOSIS — J301 Allergic rhinitis due to pollen: Secondary | ICD-10-CM | POA: Diagnosis not present

## 2023-04-14 DIAGNOSIS — Z23 Encounter for immunization: Secondary | ICD-10-CM | POA: Diagnosis not present

## 2023-04-14 DIAGNOSIS — J3081 Allergic rhinitis due to animal (cat) (dog) hair and dander: Secondary | ICD-10-CM | POA: Diagnosis not present

## 2023-04-14 DIAGNOSIS — J3089 Other allergic rhinitis: Secondary | ICD-10-CM | POA: Diagnosis not present

## 2023-04-21 DIAGNOSIS — J301 Allergic rhinitis due to pollen: Secondary | ICD-10-CM | POA: Diagnosis not present

## 2023-04-21 DIAGNOSIS — J3089 Other allergic rhinitis: Secondary | ICD-10-CM | POA: Diagnosis not present

## 2023-04-21 DIAGNOSIS — J3081 Allergic rhinitis due to animal (cat) (dog) hair and dander: Secondary | ICD-10-CM | POA: Diagnosis not present

## 2023-04-28 DIAGNOSIS — J301 Allergic rhinitis due to pollen: Secondary | ICD-10-CM | POA: Diagnosis not present

## 2023-04-28 DIAGNOSIS — J3089 Other allergic rhinitis: Secondary | ICD-10-CM | POA: Diagnosis not present

## 2023-05-05 DIAGNOSIS — J301 Allergic rhinitis due to pollen: Secondary | ICD-10-CM | POA: Diagnosis not present

## 2023-05-05 DIAGNOSIS — J3081 Allergic rhinitis due to animal (cat) (dog) hair and dander: Secondary | ICD-10-CM | POA: Diagnosis not present

## 2023-05-05 DIAGNOSIS — J3089 Other allergic rhinitis: Secondary | ICD-10-CM | POA: Diagnosis not present

## 2023-05-11 DIAGNOSIS — J3081 Allergic rhinitis due to animal (cat) (dog) hair and dander: Secondary | ICD-10-CM | POA: Diagnosis not present

## 2023-05-11 DIAGNOSIS — J3089 Other allergic rhinitis: Secondary | ICD-10-CM | POA: Diagnosis not present

## 2023-05-11 DIAGNOSIS — J301 Allergic rhinitis due to pollen: Secondary | ICD-10-CM | POA: Diagnosis not present

## 2023-05-13 DIAGNOSIS — F413 Other mixed anxiety disorders: Secondary | ICD-10-CM | POA: Diagnosis not present

## 2023-05-13 DIAGNOSIS — F332 Major depressive disorder, recurrent severe without psychotic features: Secondary | ICD-10-CM | POA: Diagnosis not present

## 2023-05-18 DIAGNOSIS — J3089 Other allergic rhinitis: Secondary | ICD-10-CM | POA: Diagnosis not present

## 2023-05-18 DIAGNOSIS — J301 Allergic rhinitis due to pollen: Secondary | ICD-10-CM | POA: Diagnosis not present

## 2023-05-18 DIAGNOSIS — J3081 Allergic rhinitis due to animal (cat) (dog) hair and dander: Secondary | ICD-10-CM | POA: Diagnosis not present

## 2023-06-01 DIAGNOSIS — J3089 Other allergic rhinitis: Secondary | ICD-10-CM | POA: Diagnosis not present

## 2023-06-01 DIAGNOSIS — J3081 Allergic rhinitis due to animal (cat) (dog) hair and dander: Secondary | ICD-10-CM | POA: Diagnosis not present

## 2023-06-01 DIAGNOSIS — J301 Allergic rhinitis due to pollen: Secondary | ICD-10-CM | POA: Diagnosis not present

## 2023-06-09 DIAGNOSIS — J301 Allergic rhinitis due to pollen: Secondary | ICD-10-CM | POA: Diagnosis not present

## 2023-06-09 DIAGNOSIS — J3081 Allergic rhinitis due to animal (cat) (dog) hair and dander: Secondary | ICD-10-CM | POA: Diagnosis not present

## 2023-06-09 DIAGNOSIS — J3089 Other allergic rhinitis: Secondary | ICD-10-CM | POA: Diagnosis not present

## 2023-06-15 DIAGNOSIS — J301 Allergic rhinitis due to pollen: Secondary | ICD-10-CM | POA: Diagnosis not present

## 2023-06-15 DIAGNOSIS — J3081 Allergic rhinitis due to animal (cat) (dog) hair and dander: Secondary | ICD-10-CM | POA: Diagnosis not present

## 2023-06-15 DIAGNOSIS — J3089 Other allergic rhinitis: Secondary | ICD-10-CM | POA: Diagnosis not present

## 2023-06-30 DIAGNOSIS — J3089 Other allergic rhinitis: Secondary | ICD-10-CM | POA: Diagnosis not present

## 2023-06-30 DIAGNOSIS — J301 Allergic rhinitis due to pollen: Secondary | ICD-10-CM | POA: Diagnosis not present

## 2023-07-12 DIAGNOSIS — J3081 Allergic rhinitis due to animal (cat) (dog) hair and dander: Secondary | ICD-10-CM | POA: Diagnosis not present

## 2023-07-12 DIAGNOSIS — J301 Allergic rhinitis due to pollen: Secondary | ICD-10-CM | POA: Diagnosis not present

## 2023-07-12 DIAGNOSIS — J3089 Other allergic rhinitis: Secondary | ICD-10-CM | POA: Diagnosis not present

## 2023-07-22 DIAGNOSIS — J3089 Other allergic rhinitis: Secondary | ICD-10-CM | POA: Diagnosis not present

## 2023-07-22 DIAGNOSIS — F332 Major depressive disorder, recurrent severe without psychotic features: Secondary | ICD-10-CM | POA: Diagnosis not present

## 2023-07-22 DIAGNOSIS — J301 Allergic rhinitis due to pollen: Secondary | ICD-10-CM | POA: Diagnosis not present

## 2023-07-22 DIAGNOSIS — F413 Other mixed anxiety disorders: Secondary | ICD-10-CM | POA: Diagnosis not present

## 2023-07-29 DIAGNOSIS — R030 Elevated blood-pressure reading, without diagnosis of hypertension: Secondary | ICD-10-CM | POA: Diagnosis not present

## 2023-07-29 DIAGNOSIS — Z0000 Encounter for general adult medical examination without abnormal findings: Secondary | ICD-10-CM | POA: Diagnosis not present

## 2023-07-29 DIAGNOSIS — R6889 Other general symptoms and signs: Secondary | ICD-10-CM | POA: Diagnosis not present

## 2023-07-29 DIAGNOSIS — R7303 Prediabetes: Secondary | ICD-10-CM | POA: Diagnosis not present

## 2023-07-29 DIAGNOSIS — J3089 Other allergic rhinitis: Secondary | ICD-10-CM | POA: Diagnosis not present

## 2023-07-29 DIAGNOSIS — Z79899 Other long term (current) drug therapy: Secondary | ICD-10-CM | POA: Diagnosis not present

## 2023-07-29 DIAGNOSIS — E669 Obesity, unspecified: Secondary | ICD-10-CM | POA: Diagnosis not present

## 2023-07-29 DIAGNOSIS — E559 Vitamin D deficiency, unspecified: Secondary | ICD-10-CM | POA: Diagnosis not present

## 2023-07-29 DIAGNOSIS — J301 Allergic rhinitis due to pollen: Secondary | ICD-10-CM | POA: Diagnosis not present

## 2023-07-29 DIAGNOSIS — J3081 Allergic rhinitis due to animal (cat) (dog) hair and dander: Secondary | ICD-10-CM | POA: Diagnosis not present

## 2023-08-05 DIAGNOSIS — J3081 Allergic rhinitis due to animal (cat) (dog) hair and dander: Secondary | ICD-10-CM | POA: Diagnosis not present

## 2023-08-05 DIAGNOSIS — J301 Allergic rhinitis due to pollen: Secondary | ICD-10-CM | POA: Diagnosis not present

## 2023-08-05 DIAGNOSIS — J3089 Other allergic rhinitis: Secondary | ICD-10-CM | POA: Diagnosis not present

## 2023-08-12 DIAGNOSIS — J3089 Other allergic rhinitis: Secondary | ICD-10-CM | POA: Diagnosis not present

## 2023-08-12 DIAGNOSIS — J301 Allergic rhinitis due to pollen: Secondary | ICD-10-CM | POA: Diagnosis not present

## 2023-08-27 DIAGNOSIS — J301 Allergic rhinitis due to pollen: Secondary | ICD-10-CM | POA: Diagnosis not present

## 2023-08-27 DIAGNOSIS — J3089 Other allergic rhinitis: Secondary | ICD-10-CM | POA: Diagnosis not present

## 2023-08-27 DIAGNOSIS — J3081 Allergic rhinitis due to animal (cat) (dog) hair and dander: Secondary | ICD-10-CM | POA: Diagnosis not present

## 2023-09-02 DIAGNOSIS — J3089 Other allergic rhinitis: Secondary | ICD-10-CM | POA: Diagnosis not present

## 2023-09-02 DIAGNOSIS — J301 Allergic rhinitis due to pollen: Secondary | ICD-10-CM | POA: Diagnosis not present

## 2023-09-10 DIAGNOSIS — J3089 Other allergic rhinitis: Secondary | ICD-10-CM | POA: Diagnosis not present

## 2023-09-10 DIAGNOSIS — J3081 Allergic rhinitis due to animal (cat) (dog) hair and dander: Secondary | ICD-10-CM | POA: Diagnosis not present

## 2023-09-10 DIAGNOSIS — J301 Allergic rhinitis due to pollen: Secondary | ICD-10-CM | POA: Diagnosis not present

## 2023-09-15 DIAGNOSIS — J3089 Other allergic rhinitis: Secondary | ICD-10-CM | POA: Diagnosis not present

## 2023-09-15 DIAGNOSIS — J301 Allergic rhinitis due to pollen: Secondary | ICD-10-CM | POA: Diagnosis not present

## 2023-09-15 DIAGNOSIS — I10 Essential (primary) hypertension: Secondary | ICD-10-CM | POA: Diagnosis not present

## 2023-09-23 DIAGNOSIS — J301 Allergic rhinitis due to pollen: Secondary | ICD-10-CM | POA: Diagnosis not present

## 2023-09-23 DIAGNOSIS — J3089 Other allergic rhinitis: Secondary | ICD-10-CM | POA: Diagnosis not present

## 2023-09-30 DIAGNOSIS — J3081 Allergic rhinitis due to animal (cat) (dog) hair and dander: Secondary | ICD-10-CM | POA: Diagnosis not present

## 2023-09-30 DIAGNOSIS — J301 Allergic rhinitis due to pollen: Secondary | ICD-10-CM | POA: Diagnosis not present

## 2023-09-30 DIAGNOSIS — J3089 Other allergic rhinitis: Secondary | ICD-10-CM | POA: Diagnosis not present

## 2023-10-04 DIAGNOSIS — F332 Major depressive disorder, recurrent severe without psychotic features: Secondary | ICD-10-CM | POA: Diagnosis not present

## 2023-10-04 DIAGNOSIS — F413 Other mixed anxiety disorders: Secondary | ICD-10-CM | POA: Diagnosis not present

## 2023-10-06 DIAGNOSIS — J301 Allergic rhinitis due to pollen: Secondary | ICD-10-CM | POA: Diagnosis not present

## 2023-10-07 DIAGNOSIS — J3089 Other allergic rhinitis: Secondary | ICD-10-CM | POA: Diagnosis not present

## 2023-10-07 DIAGNOSIS — J301 Allergic rhinitis due to pollen: Secondary | ICD-10-CM | POA: Diagnosis not present

## 2023-10-07 DIAGNOSIS — J3081 Allergic rhinitis due to animal (cat) (dog) hair and dander: Secondary | ICD-10-CM | POA: Diagnosis not present

## 2023-10-15 DIAGNOSIS — J301 Allergic rhinitis due to pollen: Secondary | ICD-10-CM | POA: Diagnosis not present

## 2023-10-15 DIAGNOSIS — J3081 Allergic rhinitis due to animal (cat) (dog) hair and dander: Secondary | ICD-10-CM | POA: Diagnosis not present

## 2023-10-15 DIAGNOSIS — J3089 Other allergic rhinitis: Secondary | ICD-10-CM | POA: Diagnosis not present

## 2023-10-19 DIAGNOSIS — N760 Acute vaginitis: Secondary | ICD-10-CM | POA: Diagnosis not present

## 2023-10-19 DIAGNOSIS — N959 Unspecified menopausal and perimenopausal disorder: Secondary | ICD-10-CM | POA: Diagnosis not present

## 2023-10-22 DIAGNOSIS — Z309 Encounter for contraceptive management, unspecified: Secondary | ICD-10-CM | POA: Diagnosis not present

## 2023-10-28 DIAGNOSIS — J3089 Other allergic rhinitis: Secondary | ICD-10-CM | POA: Diagnosis not present

## 2023-10-28 DIAGNOSIS — J3081 Allergic rhinitis due to animal (cat) (dog) hair and dander: Secondary | ICD-10-CM | POA: Diagnosis not present

## 2023-10-28 DIAGNOSIS — J301 Allergic rhinitis due to pollen: Secondary | ICD-10-CM | POA: Diagnosis not present

## 2023-11-04 DIAGNOSIS — J3089 Other allergic rhinitis: Secondary | ICD-10-CM | POA: Diagnosis not present

## 2023-11-04 DIAGNOSIS — J301 Allergic rhinitis due to pollen: Secondary | ICD-10-CM | POA: Diagnosis not present

## 2023-11-04 DIAGNOSIS — J3081 Allergic rhinitis due to animal (cat) (dog) hair and dander: Secondary | ICD-10-CM | POA: Diagnosis not present

## 2023-11-12 DIAGNOSIS — J301 Allergic rhinitis due to pollen: Secondary | ICD-10-CM | POA: Diagnosis not present

## 2023-11-12 DIAGNOSIS — J3089 Other allergic rhinitis: Secondary | ICD-10-CM | POA: Diagnosis not present

## 2023-11-12 DIAGNOSIS — J3081 Allergic rhinitis due to animal (cat) (dog) hair and dander: Secondary | ICD-10-CM | POA: Diagnosis not present

## 2023-11-19 DIAGNOSIS — J301 Allergic rhinitis due to pollen: Secondary | ICD-10-CM | POA: Diagnosis not present

## 2023-11-19 DIAGNOSIS — J3089 Other allergic rhinitis: Secondary | ICD-10-CM | POA: Diagnosis not present

## 2023-11-25 DIAGNOSIS — J309 Allergic rhinitis, unspecified: Secondary | ICD-10-CM | POA: Diagnosis not present

## 2023-12-10 DIAGNOSIS — J3089 Other allergic rhinitis: Secondary | ICD-10-CM | POA: Diagnosis not present

## 2023-12-10 DIAGNOSIS — J301 Allergic rhinitis due to pollen: Secondary | ICD-10-CM | POA: Diagnosis not present

## 2023-12-10 DIAGNOSIS — J3081 Allergic rhinitis due to animal (cat) (dog) hair and dander: Secondary | ICD-10-CM | POA: Diagnosis not present

## 2023-12-16 DIAGNOSIS — N959 Unspecified menopausal and perimenopausal disorder: Secondary | ICD-10-CM | POA: Diagnosis not present

## 2023-12-16 DIAGNOSIS — J3081 Allergic rhinitis due to animal (cat) (dog) hair and dander: Secondary | ICD-10-CM | POA: Diagnosis not present

## 2023-12-16 DIAGNOSIS — J3089 Other allergic rhinitis: Secondary | ICD-10-CM | POA: Diagnosis not present

## 2023-12-16 DIAGNOSIS — J301 Allergic rhinitis due to pollen: Secondary | ICD-10-CM | POA: Diagnosis not present

## 2023-12-31 DIAGNOSIS — J3081 Allergic rhinitis due to animal (cat) (dog) hair and dander: Secondary | ICD-10-CM | POA: Diagnosis not present

## 2023-12-31 DIAGNOSIS — J301 Allergic rhinitis due to pollen: Secondary | ICD-10-CM | POA: Diagnosis not present

## 2023-12-31 DIAGNOSIS — J3089 Other allergic rhinitis: Secondary | ICD-10-CM | POA: Diagnosis not present

## 2024-01-07 DIAGNOSIS — J301 Allergic rhinitis due to pollen: Secondary | ICD-10-CM | POA: Diagnosis not present

## 2024-01-07 DIAGNOSIS — J3089 Other allergic rhinitis: Secondary | ICD-10-CM | POA: Diagnosis not present

## 2024-01-07 DIAGNOSIS — J3081 Allergic rhinitis due to animal (cat) (dog) hair and dander: Secondary | ICD-10-CM | POA: Diagnosis not present

## 2024-01-13 DIAGNOSIS — J3089 Other allergic rhinitis: Secondary | ICD-10-CM | POA: Diagnosis not present

## 2024-01-13 DIAGNOSIS — J3081 Allergic rhinitis due to animal (cat) (dog) hair and dander: Secondary | ICD-10-CM | POA: Diagnosis not present

## 2024-01-13 DIAGNOSIS — J301 Allergic rhinitis due to pollen: Secondary | ICD-10-CM | POA: Diagnosis not present

## 2024-01-21 DIAGNOSIS — J3081 Allergic rhinitis due to animal (cat) (dog) hair and dander: Secondary | ICD-10-CM | POA: Diagnosis not present

## 2024-01-21 DIAGNOSIS — J3089 Other allergic rhinitis: Secondary | ICD-10-CM | POA: Diagnosis not present

## 2024-01-21 DIAGNOSIS — J301 Allergic rhinitis due to pollen: Secondary | ICD-10-CM | POA: Diagnosis not present

## 2024-02-04 DIAGNOSIS — J3081 Allergic rhinitis due to animal (cat) (dog) hair and dander: Secondary | ICD-10-CM | POA: Diagnosis not present

## 2024-02-04 DIAGNOSIS — J3089 Other allergic rhinitis: Secondary | ICD-10-CM | POA: Diagnosis not present

## 2024-02-04 DIAGNOSIS — J301 Allergic rhinitis due to pollen: Secondary | ICD-10-CM | POA: Diagnosis not present

## 2024-02-15 DIAGNOSIS — J069 Acute upper respiratory infection, unspecified: Secondary | ICD-10-CM | POA: Diagnosis not present

## 2024-02-23 DIAGNOSIS — R062 Wheezing: Secondary | ICD-10-CM | POA: Diagnosis not present

## 2024-02-23 DIAGNOSIS — H1045 Other chronic allergic conjunctivitis: Secondary | ICD-10-CM | POA: Diagnosis not present

## 2024-02-23 DIAGNOSIS — J301 Allergic rhinitis due to pollen: Secondary | ICD-10-CM | POA: Diagnosis not present

## 2024-02-23 DIAGNOSIS — J3089 Other allergic rhinitis: Secondary | ICD-10-CM | POA: Diagnosis not present

## 2024-02-25 DIAGNOSIS — J3081 Allergic rhinitis due to animal (cat) (dog) hair and dander: Secondary | ICD-10-CM | POA: Diagnosis not present

## 2024-02-25 DIAGNOSIS — J3089 Other allergic rhinitis: Secondary | ICD-10-CM | POA: Diagnosis not present

## 2024-02-25 DIAGNOSIS — J301 Allergic rhinitis due to pollen: Secondary | ICD-10-CM | POA: Diagnosis not present

## 2024-03-10 DIAGNOSIS — J3081 Allergic rhinitis due to animal (cat) (dog) hair and dander: Secondary | ICD-10-CM | POA: Diagnosis not present

## 2024-03-10 DIAGNOSIS — J3089 Other allergic rhinitis: Secondary | ICD-10-CM | POA: Diagnosis not present

## 2024-03-10 DIAGNOSIS — J301 Allergic rhinitis due to pollen: Secondary | ICD-10-CM | POA: Diagnosis not present

## 2024-03-16 DIAGNOSIS — Z7689 Persons encountering health services in other specified circumstances: Secondary | ICD-10-CM | POA: Diagnosis not present

## 2024-03-16 DIAGNOSIS — I10 Essential (primary) hypertension: Secondary | ICD-10-CM | POA: Diagnosis not present

## 2024-03-16 DIAGNOSIS — N951 Menopausal and female climacteric states: Secondary | ICD-10-CM | POA: Diagnosis not present

## 2024-03-16 DIAGNOSIS — N959 Unspecified menopausal and perimenopausal disorder: Secondary | ICD-10-CM | POA: Diagnosis not present

## 2024-03-30 DIAGNOSIS — Z6834 Body mass index (BMI) 34.0-34.9, adult: Secondary | ICD-10-CM | POA: Diagnosis not present

## 2024-03-30 DIAGNOSIS — Z1231 Encounter for screening mammogram for malignant neoplasm of breast: Secondary | ICD-10-CM | POA: Diagnosis not present

## 2024-03-30 DIAGNOSIS — Z01419 Encounter for gynecological examination (general) (routine) without abnormal findings: Secondary | ICD-10-CM | POA: Diagnosis not present

## 2024-04-14 DIAGNOSIS — J3089 Other allergic rhinitis: Secondary | ICD-10-CM | POA: Diagnosis not present

## 2024-04-14 DIAGNOSIS — J301 Allergic rhinitis due to pollen: Secondary | ICD-10-CM | POA: Diagnosis not present

## 2024-05-05 DIAGNOSIS — J301 Allergic rhinitis due to pollen: Secondary | ICD-10-CM | POA: Diagnosis not present

## 2024-05-05 DIAGNOSIS — J3081 Allergic rhinitis due to animal (cat) (dog) hair and dander: Secondary | ICD-10-CM | POA: Diagnosis not present

## 2024-05-05 DIAGNOSIS — J3089 Other allergic rhinitis: Secondary | ICD-10-CM | POA: Diagnosis not present

## 2024-05-26 DIAGNOSIS — J3081 Allergic rhinitis due to animal (cat) (dog) hair and dander: Secondary | ICD-10-CM | POA: Diagnosis not present

## 2024-05-26 DIAGNOSIS — J3089 Other allergic rhinitis: Secondary | ICD-10-CM | POA: Diagnosis not present

## 2024-05-26 DIAGNOSIS — J301 Allergic rhinitis due to pollen: Secondary | ICD-10-CM | POA: Diagnosis not present

## 2024-06-22 DIAGNOSIS — J301 Allergic rhinitis due to pollen: Secondary | ICD-10-CM | POA: Diagnosis not present

## 2024-06-22 DIAGNOSIS — J3089 Other allergic rhinitis: Secondary | ICD-10-CM | POA: Diagnosis not present

## 2024-06-30 DIAGNOSIS — J3081 Allergic rhinitis due to animal (cat) (dog) hair and dander: Secondary | ICD-10-CM | POA: Diagnosis not present

## 2024-06-30 DIAGNOSIS — J301 Allergic rhinitis due to pollen: Secondary | ICD-10-CM | POA: Diagnosis not present
